# Patient Record
Sex: Male | Born: 1960
Health system: Southern US, Community
[De-identification: ages and names within clinical notes are randomized; demographics above are authoritative.]

## PROBLEM LIST (undated history)

## (undated) HISTORY — PX: NO PAST SURGERIES: SHX2092

---
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
## 2006-02-08 ENCOUNTER — Emergency Department (HOSPITAL_COMMUNITY): Admission: EM | Admit: 2006-02-08 | Discharge: 2006-02-08 | Payer: Self-pay | Admitting: Family Medicine

## 2011-01-05 ENCOUNTER — Other Ambulatory Visit: Payer: Self-pay | Admitting: Otolaryngology

## 2011-01-13 ENCOUNTER — Ambulatory Visit
Admission: RE | Admit: 2011-01-13 | Discharge: 2011-01-13 | Disposition: A | Discharge status: Home / Self Care | Payer: BC Managed Care – PPO | Source: Ambulatory Visit | Attending: Otolaryngology | Admitting: Otolaryngology

## 2014-01-03 DIAGNOSIS — E785 Hyperlipidemia, unspecified: Secondary | ICD-10-CM | POA: Insufficient documentation

## 2014-01-03 DIAGNOSIS — I493 Ventricular premature depolarization: Secondary | ICD-10-CM | POA: Insufficient documentation

## 2014-01-03 HISTORY — DX: Ventricular premature depolarization: I49.3

## 2014-01-03 HISTORY — DX: Hyperlipidemia, unspecified: E78.5

## 2015-09-01 DIAGNOSIS — A6920 Lyme disease, unspecified: Secondary | ICD-10-CM

## 2015-09-01 HISTORY — DX: Lyme disease, unspecified: A69.20

## 2017-03-25 ENCOUNTER — Ambulatory Visit
Admission: RE | Admit: 2017-03-25 | Discharge: 2017-03-25 | Disposition: A | Discharge status: Home / Self Care | Payer: Managed Care, Other (non HMO) | Source: Ambulatory Visit | Attending: Medical | Admitting: Medical

## 2017-03-25 ENCOUNTER — Other Ambulatory Visit: Payer: Self-pay | Admitting: Medical

## 2017-03-25 DIAGNOSIS — R109 Unspecified abdominal pain: Secondary | ICD-10-CM

## 2018-10-07 DIAGNOSIS — L0390 Cellulitis, unspecified: Secondary | ICD-10-CM | POA: Diagnosis not present

## 2019-03-14 DIAGNOSIS — R252 Cramp and spasm: Secondary | ICD-10-CM | POA: Diagnosis not present

## 2019-03-14 DIAGNOSIS — I499 Cardiac arrhythmia, unspecified: Secondary | ICD-10-CM | POA: Diagnosis not present

## 2019-03-14 DIAGNOSIS — R5383 Other fatigue: Secondary | ICD-10-CM | POA: Diagnosis not present

## 2019-03-16 DIAGNOSIS — R5383 Other fatigue: Secondary | ICD-10-CM | POA: Diagnosis not present

## 2019-03-16 DIAGNOSIS — R7309 Other abnormal glucose: Secondary | ICD-10-CM | POA: Diagnosis not present

## 2019-06-14 DIAGNOSIS — K591 Functional diarrhea: Secondary | ICD-10-CM | POA: Diagnosis not present

## 2019-07-02 DIAGNOSIS — K4090 Unilateral inguinal hernia, without obstruction or gangrene, not specified as recurrent: Secondary | ICD-10-CM | POA: Diagnosis not present

## 2019-07-10 DIAGNOSIS — R1031 Right lower quadrant pain: Secondary | ICD-10-CM | POA: Diagnosis not present

## 2019-07-10 DIAGNOSIS — K4020 Bilateral inguinal hernia, without obstruction or gangrene, not specified as recurrent: Secondary | ICD-10-CM | POA: Diagnosis not present

## 2019-07-19 DIAGNOSIS — R1031 Right lower quadrant pain: Secondary | ICD-10-CM | POA: Diagnosis not present

## 2019-07-19 DIAGNOSIS — Z87891 Personal history of nicotine dependence: Secondary | ICD-10-CM | POA: Diagnosis not present

## 2019-07-19 DIAGNOSIS — K4020 Bilateral inguinal hernia, without obstruction or gangrene, not specified as recurrent: Secondary | ICD-10-CM | POA: Diagnosis not present

## 2019-07-19 DIAGNOSIS — R110 Nausea: Secondary | ICD-10-CM | POA: Diagnosis not present

## 2019-07-19 DIAGNOSIS — K4021 Bilateral inguinal hernia, without obstruction or gangrene, recurrent: Secondary | ICD-10-CM | POA: Diagnosis not present

## 2019-12-25 ENCOUNTER — Ambulatory Visit: Payer: BC Managed Care – PPO | Admitting: Legal Medicine

## 2019-12-25 ENCOUNTER — Other Ambulatory Visit: Payer: Self-pay

## 2019-12-25 ENCOUNTER — Encounter: Payer: Self-pay | Admitting: Legal Medicine

## 2019-12-25 VITALS — BP 118/70 | HR 63 | Temp 98.0°F | Resp 16 | Ht 73.0 in | Wt 160.4 lb

## 2019-12-25 DIAGNOSIS — E782 Mixed hyperlipidemia: Secondary | ICD-10-CM | POA: Diagnosis not present

## 2019-12-25 DIAGNOSIS — A6920 Lyme disease, unspecified: Secondary | ICD-10-CM | POA: Insufficient documentation

## 2019-12-25 DIAGNOSIS — R5382 Chronic fatigue, unspecified: Secondary | ICD-10-CM | POA: Diagnosis not present

## 2019-12-25 DIAGNOSIS — R195 Other fecal abnormalities: Secondary | ICD-10-CM

## 2019-12-25 DIAGNOSIS — R5383 Other fatigue: Secondary | ICD-10-CM | POA: Diagnosis not present

## 2019-12-25 HISTORY — DX: Other fatigue: R53.83

## 2019-12-25 HISTORY — DX: Mixed hyperlipidemia: E78.2

## 2019-12-25 HISTORY — DX: Other fecal abnormalities: R19.5

## 2019-12-25 NOTE — Assessment & Plan Note (Signed)
[place].  Not seen in one year.

## 2019-12-25 NOTE — Progress Notes (Signed)
New Patient Office Visit  Subjective:  Patient ID: Phillip Henry, male    DOB: Mar 13, 1961  Age: 59 y.o. MRN: [id number]  CC:  Chief Complaint  Patient presents with  . Anxiety  . Diarrhea    Yellow    HPI Phillip Henry presents for anxiety, depression.  It has been going on for years.  He is active, he works 7 days a week.   His BP was up at night but OK here today.  Eats healthy. He is getting dyspnea working or with any activity. No weight changes.  He is having yellow stool for weeks but no pain with eating. No alcohol use.  Past Medical History:  Diagnosis Date  . Lyme disease 2017    History reviewed. No pertinent surgical history.  Family History  Problem Relation Age of Onset  . Rheum arthritis Mother   . Heart failure Father     Social History   Socioeconomic History  . Marital status: Married    Spouse name: Not on file  . Number of children: Not on file  . Years of education: Not on file  . Highest education level: Not on file  Occupational History  . Not on file  Tobacco Use  . Smoking status: Never Smoker  . Smokeless tobacco: Never Used  Substance and Sexual Activity  . Alcohol use: Never  . Drug use: Never  . Sexual activity: Not on file  Other Topics Concern  . Not on file  Social History Narrative  . Not on file   Social Determinants of Health   Financial Resource Strain:   . Difficulty of Paying Living Expenses:   Food Insecurity:   . Worried About Programme researcher, broadcasting/film/video in the Last Year:   . Barista in the Last Year:   Transportation Needs:   . Freight forwarder (Medical):   Marland Kitchen Lack of Transportation (Non-Medical):   Physical Activity:   . Days of Exercise per Week:   . Minutes of Exercise per Session:   Stress:   . Feeling of Stress :   Social Connections:   . Frequency of Communication with Friends and Family:   . Frequency of Social Gatherings with Friends and Family:   . Attends Religious Services:   . Active  Member of Clubs or Organizations:   . Attends Banker Meetings:   Marland Kitchen Marital Status:   Intimate Partner Violence:   . Fear of Current or Ex-Partner:   . Emotionally Abused:   Marland Kitchen Physically Abused:   . Sexually Abused:     ROS Review of Systems  Constitutional: Negative.   HENT: Negative.   Eyes: Negative.   Respiratory: Negative.   Cardiovascular: Negative.   Gastrointestinal: Negative.   Endocrine: Negative.   Genitourinary: Negative.   Musculoskeletal: Negative.   Skin: Negative.   Neurological: Negative.   Psychiatric/Behavioral: Positive for dysphoric mood.  Alcohol intolerant.  Objective:   Today's Vitals: BP 118/70   Pulse 63   Temp 98 F (36.7 C)   Resp 16   Ht 6\' 1"  (1.854 m)   Wt 160 lb 6.4 oz (72.8 kg)   SpO2 98%   BMI 21.16 kg/m   Physical Exam Vitals reviewed.  Constitutional:      Appearance: Normal appearance. He is normal weight.  HENT:     Head: Normocephalic and atraumatic.     Right Ear: Tympanic membrane normal.     Left Ear: Tympanic membrane normal.  Nose: Nose normal.     Mouth/Throat:     Mouth: Mucous membranes are dry.  Eyes:     Extraocular Movements: Extraocular movements intact.     Conjunctiva/sclera: Conjunctivae normal.     Pupils: Pupils are equal, round, and reactive to light.  Cardiovascular:     Rate and Rhythm: Normal rate and regular rhythm.     Pulses: Normal pulses.     Heart sounds: Normal heart sounds.  Pulmonary:     Effort: Pulmonary effort is normal.     Breath sounds: Normal breath sounds.  Abdominal:     General: Abdomen is flat. Bowel sounds are normal.     Palpations: Abdomen is soft.  Musculoskeletal:        General: Normal range of motion.     Cervical back: Normal range of motion and neck supple.  Skin:    General: Skin is warm and dry.  Neurological:     General: No focal deficit present.     Mental Status: He is alert and oriented to person, place, and time.   PHQ9=9 EKG: Sinus  brady cardia, rate 53, QRS 50ms, PR 1118 ms, QTC 394, axis 35. Assessment & Plan:   Problem List Items Addressed This Visit      Other   Lyme disease    [place].  Not seen in one year.      Relevant Orders   US Abdomen Complete   Fatigue - Primary    Patient has chronic fatigue and worse for last 2 weeks, he gets dyspnea at little activity, EKG show sinus bradycardia and patient is not an regular exerciser.  Refer for cardiology evaluation.      Relevant Orders   CBC with Differential/Platelet   Comprehensive metabolic panel   TSH   EKG 12-Lead   US Abdomen Complete   Ambulatory referral to Cardiology   Acholic stool    Suspect gall bladder or liver disease.  No tenderness in abdomen.      Mixed hyperlipidemia    Patient has history of hyperlipidemia but not checked in years.  We will get lipid level      Relevant Orders   Lipid panel      No outpatient encounter medications on file as of 12/25/2019.   No facility-administered encounter medications on file as of 12/25/2019.    Follow-up: Return in about 2 weeks (around 01/08/2020).   Reinaldo Meeker, MD

## 2019-12-25 NOTE — Assessment & Plan Note (Signed)
Suspect gall bladder or liver disease.  No tenderness in abdomen.

## 2019-12-25 NOTE — Assessment & Plan Note (Signed)
Patient has chronic fatigue and worse for last 2 weeks, he gets dyspnea at little activity, EKG show sinus bradycardia and patient is not an regular exerciser.  Refer for cardiology evaluation.

## 2019-12-25 NOTE — Assessment & Plan Note (Signed)
Patient has history of hyperlipidemia but not checked in years.  We will get lipid level

## 2019-12-26 LAB — CBC WITH DIFFERENTIAL/PLATELET
Basophils Absolute: 0 10*3/uL (ref 0.0–0.2)
Basos: 1 %
EOS (ABSOLUTE): 0.1 10*3/uL (ref 0.0–0.4)
Eos: 2 %
Hematocrit: 44.4 % (ref 37.5–51.0)
Hemoglobin: 14.5 g/dL (ref 13.0–17.7)
Immature Grans (Abs): 0 10*3/uL (ref 0.0–0.1)
Immature Granulocytes: 0 %
Lymphocytes Absolute: 1.3 10*3/uL (ref 0.7–3.1)
Lymphs: 23 %
MCH: 29.7 pg (ref 26.6–33.0)
MCHC: 32.7 g/dL (ref 31.5–35.7)
MCV: 91 fL (ref 79–97)
Monocytes Absolute: 0.6 10*3/uL (ref 0.1–0.9)
Monocytes: 11 %
Neutrophils Absolute: 3.6 10*3/uL (ref 1.4–7.0)
Neutrophils: 63 %
Platelets: 237 10*3/uL (ref 150–450)
RBC: 4.88 x10E6/uL (ref 4.14–5.80)
RDW: 13.1 % (ref 11.6–15.4)
WBC: 5.7 10*3/uL (ref 3.4–10.8)

## 2019-12-26 LAB — COMPREHENSIVE METABOLIC PANEL
ALT: 17 IU/L (ref 0–44)
AST: 19 IU/L (ref 0–40)
Albumin/Globulin Ratio: 2.4 — ABNORMAL HIGH (ref 1.2–2.2)
Albumin: 4.3 g/dL (ref 3.8–4.9)
Alkaline Phosphatase: 71 IU/L (ref 39–117)
BUN/Creatinine Ratio: 16 (ref 10–24)
BUN: 18 mg/dL (ref 8–27)
Bilirubin Total: 0.3 mg/dL (ref 0.0–1.2)
CO2: 26 mmol/L (ref 20–29)
Calcium: 9.4 mg/dL (ref 8.6–10.2)
Chloride: 107 mmol/L — ABNORMAL HIGH (ref 96–106)
Creatinine, Ser: 1.14 mg/dL (ref 0.76–1.27)
GFR calc Af Amer: 80 mL/min/{1.73_m2} (ref >59)
GFR calc non Af Amer: 70 mL/min/{1.73_m2} (ref >59)
Globulin, Total: 1.8 g/dL (ref 1.5–4.5)
Glucose: 89 mg/dL (ref 65–99)
Potassium: 4.6 mmol/L (ref 3.5–5.2)
Sodium: 144 mmol/L (ref 134–144)
Total Protein: 6.1 g/dL (ref 6.0–8.5)

## 2019-12-26 LAB — LIPID PANEL
Chol/HDL Ratio: 2.6 ratio (ref 0.0–5.0)
Cholesterol, Total: 178 mg/dL (ref 100–199)
HDL: 68 mg/dL (ref >39)
LDL Chol Calc (NIH): 90 mg/dL (ref 0–99)
Triglycerides: 113 mg/dL (ref 0–149)
VLDL Cholesterol Cal: 20 mg/dL (ref 5–40)

## 2019-12-26 LAB — CARDIOVASCULAR RISK ASSESSMENT

## 2019-12-26 LAB — TSH: TSH: 1.92 u[IU]/mL (ref 0.450–4.500)

## 2019-12-26 NOTE — Progress Notes (Signed)
CBC all normal, kidney tests normal, liver tests normal, Cholesterol normal good, TSH normal level lp

## 2020-01-02 ENCOUNTER — Ambulatory Visit (INDEPENDENT_AMBULATORY_CARE_PROVIDER_SITE_OTHER): Payer: BC Managed Care – PPO | Admitting: Cardiology

## 2020-01-02 ENCOUNTER — Ambulatory Visit (INDEPENDENT_AMBULATORY_CARE_PROVIDER_SITE_OTHER): Payer: BC Managed Care – PPO

## 2020-01-02 ENCOUNTER — Other Ambulatory Visit: Payer: Self-pay

## 2020-01-02 ENCOUNTER — Encounter: Payer: Self-pay | Admitting: Cardiology

## 2020-01-02 VITALS — BP 134/74 | HR 60 | Ht 73.0 in | Wt 163.0 lb

## 2020-01-02 DIAGNOSIS — R011 Cardiac murmur, unspecified: Secondary | ICD-10-CM

## 2020-01-02 DIAGNOSIS — R002 Palpitations: Secondary | ICD-10-CM | POA: Insufficient documentation

## 2020-01-02 DIAGNOSIS — Z8619 Personal history of other infectious and parasitic diseases: Secondary | ICD-10-CM | POA: Diagnosis not present

## 2020-01-02 DIAGNOSIS — Z299 Encounter for prophylactic measures, unspecified: Secondary | ICD-10-CM

## 2020-01-02 HISTORY — DX: Personal history of other infectious and parasitic diseases: Z86.19

## 2020-01-02 HISTORY — DX: Palpitations: R00.2

## 2020-01-02 HISTORY — DX: Cardiac murmur, unspecified: R01.1

## 2020-01-02 NOTE — Progress Notes (Signed)
Cardiology Office Note:    Date:  01/02/2020   ID:  Phillip Henry, DOB [birth year], MRN [id number]  PCP:  Lillard Anes, MD  Cardiologist:  Jenean Lindau, MD   Referring MD: Lillard Anes,*    ASSESSMENT:    1. Palpitations   2. Cardiac murmur   3. History of Lyme disease    PLAN:    In order of problems listed above:  1. Primary prevention stressed with the patient.  Importance of compliance with diet and medication stressed and he vocalized understanding.  I told him to walk at least half an hour a day on a regular basis.  He has no issues with walking. 2. Palpitations: I reassured him about my findings.  His TSH is unremarkable.  Her lab work was reviewed including lipids.  He will have a 3-day ZIO monitor to assess the symptoms. 3. Cardiac murmur: Echocardiogram will be done to assess the symptoms. 4. Risk stratification: I discussed calcium scoring CT scan and is agreeable. 5. Patient will be seen in follow-up appointment in 6 months or earlier if the patient has any concerns    Medication Adjustments/Labs and Tests Ordered: Current medicines are reviewed at length with the patient today.  Concerns regarding medicines are outlined above.  No orders of the defined types were placed in this encounter.  No orders of the defined types were placed in this encounter.    History of Present Illness:    Phillip Henry is a 59 y.o. male who is being seen today for the evaluation of palpitations at the request of Lillard Anes,*.  Patient is a pleasant 59 year old male.  He has no significant past medical history.  He is gives history of Lyme's disease.  Even his recent blood work is unremarkable including his lipids.  He mentions to me that he occasionally has elevations in blood pressure.  He says that this is probably related to his job.  No chest pain orthopnea or PND.  At the time of my evaluation, the patient is alert awake oriented and in no distress.   He mentions palpitations at times and sometimes slow heartbeat.  At the time of my evaluation, the patient is alert awake oriented and in no distress.  Past Medical History:  Diagnosis Date  . Acholic stool 4/62/7035  . Dyslipidemia 01/03/2014  . Fatigue 12/25/2019  . Lyme disease 2017  . Mixed hyperlipidemia 12/25/2019  . Premature ventricular contraction 01/03/2014    History reviewed. No pertinent surgical history.  Current Medications: No outpatient medications have been marked as taking for the 01/02/20 encounter (Office Visit) with Marny Smethers, Reita Cliche, MD.     Allergies:   Patient has no known allergies.   Social History   Socioeconomic History  . Marital status: Married    Spouse name: Not on file  . Number of children: Not on file  . Years of education: Not on file  . Highest education level: Not on file  Occupational History  . Not on file  Tobacco Use  . Smoking status: Never Smoker  . Smokeless tobacco: Never Used  Substance and Sexual Activity  . Alcohol use: Never  . Drug use: Never  . Sexual activity: Not on file  Other Topics Concern  . Not on file  Social History Narrative  . Not on file   Social Determinants of Health   Financial Resource Strain:   . Difficulty of Paying Living Expenses:   Food Insecurity:   .  Worried About Programme researcher, broadcasting/film/video in the Last Year:   . Barista in the Last Year:   Transportation Needs:   . Freight forwarder (Medical):   Marland Kitchen Lack of Transportation (Non-Medical):   Physical Activity:   . Days of Exercise per Week:   . Minutes of Exercise per Session:   Stress:   . Feeling of Stress :   Social Connections:   . Frequency of Communication with Friends and Family:   . Frequency of Social Gatherings with Friends and Family:   . Attends Religious Services:   . Active Member of Clubs or Organizations:   . Attends Banker Meetings:   Marland Kitchen Marital Status:      Family History: The patient's family history  includes Heart failure in his father; Rheum arthritis in his mother.  ROS:   Please see the history of present illness.    All other systems reviewed and are negative.  EKGs/Labs/Other Studies Reviewed:    The following studies were reviewed today: I reviewed records and EKG revealed sinus rhythm and nonspecific ST-T changes   Recent Labs: 12/25/2019: ALT 17; BUN 18; Creatinine, Ser 1.14; Hemoglobin 14.5; Platelets 237; Potassium 4.6; Sodium 144; TSH 1.920  Recent Lipid Panel    Component Value Date/Time   CHOL 178 12/25/2019 1550   TRIG 113 12/25/2019 1550   HDL 68 12/25/2019 1550   CHOLHDL 2.6 12/25/2019 1550   LDLCALC 90 12/25/2019 1550    Physical Exam:    VS:  BP 134/74   Pulse 60   Ht 6\' 1"  (1.854 m)   Wt 163 lb (73.9 kg)   SpO2 99%   BMI 21.51 kg/m     Wt Readings from Last 3 Encounters:  01/02/20 163 lb (73.9 kg)  12/25/19 160 lb 6.4 oz (72.8 kg)     GEN: Patient is in no acute distress HEENT: Normal NECK: No JVD; No carotid bruits LYMPHATICS: No lymphadenopathy CARDIAC: S1 S2 regular, 2/6 systolic murmur at the apex. RESPIRATORY:  Clear to auscultation without rales, wheezing or rhonchi  ABDOMEN: Soft, non-tender, non-distended MUSCULOSKELETAL:  No edema; No deformity  SKIN: Warm and dry NEUROLOGIC:  Alert and oriented x 3 PSYCHIATRIC:  Normal affect    Signed, 12/27/19, MD  01/02/2020 10:14 AM    [facility]

## 2020-01-02 NOTE — Patient Instructions (Signed)
Medication Instructions:  No medication changes *If you need a refill on your cardiac medications before your next appointment, please call your pharmacy*   Lab Work: No ordered If you have labs (blood work) drawn today and your tests are completely normal, you will receive your results only by: Marland Kitchen MyChart Message (if you have MyChart) OR . A paper copy in the mail If you have any lab test that is abnormal or we need to change your treatment, we will call you to review the results.   Testing/Procedures:  WHY IS MY DOCTOR PRESCRIBING ZIO? The Zio system is proven and trusted by physicians to detect and diagnose irregular heart rhythms - and has been prescribed to hundreds of thousands of patients.  The FDA has cleared the Zio system to monitor for many different kinds of irregular heart rhythms. In a study, physicians were able to reach a diagnosis 90% of the time with the Zio system1.  You can wear the Zio monitor - a small, discreet, comfortable patch - during your normal day-to-day activity, including while you sleep, shower, and exercise, while it records every single heartbeat for analysis.  1Barrett, P., et al. Comparison of 24 Hour Holter Monitoring Versus 14 Day Novel Adhesive Patch Electrocardiographic Monitoring. American Journal of Medicine, 2014.  ZIO VS. HOLTER MONITORING The Zio monitor can be comfortably worn for up to 14 days. Holter monitors can be worn for 24 to 48 hours, limiting the time to record any irregular heart rhythms you may have. Zio is able to capture data for the 51% of patients who have their first symptom-triggered arrhythmia after 48 hours.1  LIVE WITHOUT RESTRICTIONS The Zio ambulatory cardiac monitor is a small, unobtrusive, and water-resistant patch-you might even forget you're wearing it. The Zio monitor records and stores every beat of your heart, whether you're sleeping, working out, or showering. Wear for 3 days,remove 01/05/20.  Your physician has  requested that you have an echocardiogram. Echocardiography is a painless test that uses sound waves to create images of your heart. It provides your doctor with information about the size and shape of your heart and how well your heart's chambers and valves are working. This procedure takes approximately one hour. There are no restrictions for this procedure.  We will order CT coronary calcium score $150  Please call [phone] to schedule   [facility]  [address]  [place], [place] [address]     Follow-Up: At [facility], you and your health needs are our priority.  As part of our continuing mission to provide you with exceptional heart care, we have created designated Provider Care Teams.  These Care Teams include your primary Cardiologist (physician) and Advanced Practice Providers (APPs -  Physician Assistants and Nurse Practitioners) who all work together to provide you with the care you need, when you need it.  We recommend signing up for the patient portal called "MyChart".  Sign up information is provided on this After Visit Summary.  MyChart is used to connect with patients for Virtual Visits (Telemedicine).  Patients are able to view lab/test results, encounter notes, upcoming appointments, etc.  Non-urgent messages can be sent to your provider as well.   To learn more about what you can do with MyChart, go to [email or web address].    Your next appointment:   2 month(s)  The format for your next appointment:   In Person  Provider:   Belva Crome, MD   Other Instructions  Coronary Calcium  Scan A coronary calcium scan is an imaging test used to look for deposits of plaque in the inner lining of the blood vessels of the heart (coronary arteries). Plaque is made up of calcium, protein, and fatty substances. These deposits of plaque can partly clog and narrow the coronary arteries without producing any symptoms or warning signs. This puts a person at  risk for a heart attack. This test is recommended for people who are at moderate risk for heart disease. The test can find plaque deposits before symptoms develop. Tell a health care provider about:  Any allergies you have.  All medicines you are taking, including vitamins, herbs, eye drops, creams, and over-the-counter medicines.  Any problems you or family members have had with anesthetic medicines.  Any blood disorders you have.  Any surgeries you have had.  Any medical conditions you have.  Whether you are pregnant or may be pregnant. What are the risks? Generally, this is a safe procedure. However, problems may occur, including:  Harm to a pregnant woman and her unborn baby. This test involves the use of radiation. Radiation exposure can be dangerous to a pregnant woman and her unborn baby. If you are pregnant or think you may be pregnant, you should not have this procedure done.  Slight increase in the risk of cancer. This is because of the radiation involved in the test. What happens before the procedure? Ask your health care provider for any specific instructions on how to prepare for this procedure. You may be asked to avoid products that contain caffeine, tobacco, or nicotine for 4 hours before the procedure. What happens during the procedure?   You will undress and remove any jewelry from your neck or chest.  You will put on a hospital gown.  Sticky electrodes will be placed on your chest. The electrodes will be connected to an electrocardiogram (ECG) machine to record a tracing of the electrical activity of your heart.  You will lie down on a curved bed that is attached to the CT scanner.  You may be given medicine to slow down your heart rate so that clear pictures can be created.  You will be moved into the CT scanner, and the CT scanner will take pictures of your heart. During this time, you will be asked to lie still and hold your breath for 2-3 seconds at a time  while each picture of your heart is being taken. The procedure may vary among health care providers and hospitals. What happens after the procedure?  You can get dressed.  You can return to your normal activities.  It is up to you to get the results of your procedure. Ask your health care provider, or the department that is doing the procedure, when your results will be ready. Summary  A coronary calcium scan is an imaging test used to look for deposits of plaque in the inner lining of the blood vessels of the heart (coronary arteries). Plaque is made up of calcium, protein, and fatty substances.  Generally, this is a safe procedure. Tell your health care provider if you are pregnant or may be pregnant.  Ask your health care provider for any specific instructions on how to prepare for this procedure.  A CT scanner will take pictures of your heart.  You can return to your normal activities after the scan is done. This information is not intended to replace advice given to you by your health care provider. Make sure you discuss any questions  you have with your health care provider. Document Revised: 03/07/2019 Document Reviewed: 03/07/2019 Elsevier Patient Education  [place].  Echocardiogram An echocardiogram is a procedure that uses painless sound waves (ultrasound) to produce an image of the heart. Images from an echocardiogram can provide important information about:  Signs of coronary artery disease (CAD).  Aneurysm detection. An aneurysm is a weak or damaged part of an artery wall that bulges out from the normal force of blood pumping through the body.  Heart size and shape. Changes in the size or shape of the heart can be associated with certain conditions, including heart failure, aneurysm, and CAD.  Heart muscle function.  Heart valve function.  Signs of a past heart attack.  Fluid buildup around the heart.  Thickening of the heart muscle.  A tumor or  infectious growth around the heart valves. Tell a health care provider about:  Any allergies you have.  All medicines you are taking, including vitamins, herbs, eye drops, creams, and over-the-counter medicines.  Any blood disorders you have.  Any surgeries you have had.  Any medical conditions you have.  Whether you are pregnant or may be pregnant. What are the risks? Generally, this is a safe procedure. However, problems may occur, including:  Allergic reaction to dye (contrast) that may be used during the procedure. What happens before the procedure? No specific preparation is needed. You may eat and drink normally. What happens during the procedure?   An IV tube may be inserted into one of your veins.  You may receive contrast through this tube. A contrast is an injection that improves the quality of the pictures from your heart.  A gel will be applied to your chest.  A wand-like tool (transducer) will be moved over your chest. The gel will help to transmit the sound waves from the transducer.  The sound waves will harmlessly bounce off of your heart to allow the heart images to be captured in real-time motion. The images will be recorded on a computer. The procedure may vary among health care providers and hospitals. What happens after the procedure?  You may return to your normal, everyday life, including diet, activities, and medicines, unless your health care provider tells you not to do that. Summary  An echocardiogram is a procedure that uses painless sound waves (ultrasound) to produce an image of the heart.  Images from an echocardiogram can provide important information about the size and shape of your heart, heart muscle function, heart valve function, and fluid buildup around your heart.  You do not need to do anything to prepare before this procedure. You may eat and drink normally.  After the echocardiogram is completed, you may return to your normal,  everyday life, unless your health care provider tells you not to do that. This information is not intended to replace advice given to you by your health care provider. Make sure you discuss any questions you have with your health care provider. Document Revised: 12/08/2018 Document Reviewed: 09/19/2016 Elsevier Patient Education  [place].

## 2020-01-08 DIAGNOSIS — R93421 Abnormal radiologic findings on diagnostic imaging of right kidney: Secondary | ICD-10-CM | POA: Diagnosis not present

## 2020-01-08 DIAGNOSIS — I700 Atherosclerosis of aorta: Secondary | ICD-10-CM | POA: Diagnosis not present

## 2020-01-08 DIAGNOSIS — A6920 Lyme disease, unspecified: Secondary | ICD-10-CM | POA: Diagnosis not present

## 2020-01-08 DIAGNOSIS — K909 Intestinal malabsorption, unspecified: Secondary | ICD-10-CM | POA: Diagnosis not present

## 2020-01-08 DIAGNOSIS — R5383 Other fatigue: Secondary | ICD-10-CM | POA: Diagnosis not present

## 2020-01-08 DIAGNOSIS — R93422 Abnormal radiologic findings on diagnostic imaging of left kidney: Secondary | ICD-10-CM | POA: Diagnosis not present

## 2020-01-15 ENCOUNTER — Other Ambulatory Visit: Payer: Self-pay

## 2020-01-15 ENCOUNTER — Ambulatory Visit (INDEPENDENT_AMBULATORY_CARE_PROVIDER_SITE_OTHER)
Admission: RE | Admit: 2020-01-15 | Discharge: 2020-01-15 | Disposition: A | Discharge status: Home / Self Care | Payer: BC Managed Care – PPO | Source: Ambulatory Visit | Attending: Cardiology | Admitting: Cardiology

## 2020-01-15 DIAGNOSIS — Z8619 Personal history of other infectious and parasitic diseases: Secondary | ICD-10-CM

## 2020-01-16 MED ORDER — ATORVASTATIN CALCIUM 10 MG PO TABS
10.0000 mg | ORAL_TABLET | Freq: Every day | ORAL | 11 refills | Status: DC
Start: 2020-01-16 — End: 2021-12-19

## 2020-01-16 NOTE — Addendum Note (Signed)
Addended by: Eleonore Chiquito on: 01/16/2020 08:28 AM   Modules accepted: Orders

## 2020-01-18 ENCOUNTER — Other Ambulatory Visit: Payer: BC Managed Care – PPO

## 2020-02-05 ENCOUNTER — Ambulatory Visit (INDEPENDENT_AMBULATORY_CARE_PROVIDER_SITE_OTHER): Payer: BC Managed Care – PPO

## 2020-02-05 ENCOUNTER — Other Ambulatory Visit: Payer: Self-pay

## 2020-02-05 DIAGNOSIS — R011 Cardiac murmur, unspecified: Secondary | ICD-10-CM

## 2020-02-05 NOTE — Progress Notes (Signed)
Complete echocardiogram has ben performed.  Jimmy Kiylah Loyer RDCS, RVT 

## 2020-02-14 ENCOUNTER — Ambulatory Visit: Payer: BC Managed Care – PPO | Admitting: Cardiology

## 2020-02-14 ENCOUNTER — Other Ambulatory Visit: Payer: Self-pay

## 2020-02-14 VITALS — BP 128/66 | HR 62 | Ht 73.0 in | Wt 162.4 lb

## 2020-02-14 DIAGNOSIS — E782 Mixed hyperlipidemia: Secondary | ICD-10-CM | POA: Diagnosis not present

## 2020-02-14 DIAGNOSIS — I428 Other cardiomyopathies: Secondary | ICD-10-CM | POA: Diagnosis not present

## 2020-02-14 DIAGNOSIS — I429 Cardiomyopathy, unspecified: Secondary | ICD-10-CM | POA: Insufficient documentation

## 2020-02-14 HISTORY — DX: Cardiomyopathy, unspecified: I42.9

## 2020-02-14 MED ORDER — METOPROLOL TARTRATE 100 MG PO TABS
100.0000 mg | ORAL_TABLET | Freq: Once | ORAL | 0 refills | Status: DC
Start: 2020-02-14 — End: 2020-04-01

## 2020-02-14 NOTE — Patient Instructions (Addendum)
.Medication Instructions:  No medication changes. *If you need a refill on your cardiac medications before your next appointment, please call your pharmacy*   Lab Work: Your physician recommends that you have a BMET today in the office. Your physician recommends that you return for lab work in: 10 days you need to have an additional BMET drawn.  You can come Monday through Friday 8:30 am to 12:00 pm and 1:15 to 4:30. You do not need to make an appointment as the order has already been placed.    If you have labs (blood work) drawn today and your tests are completely normal, you will receive your results only by: Marland Kitchen MyChart Message (if you have MyChart) OR . A paper copy in the mail If you have any lab test that is abnormal or we need to change your treatment, we will call you to review the results.   Testing/Procedures: Your cardiac CT will be scheduled at one of the below locations:   [facility] [address] [place], [place] [address] [phone]  [facility], please arrive at the [facility] main entrance of [facility] 30 minutes prior to test start time. Proceed to the [facility] Radiology Department (first floor) to check-in and test prep.  Please follow these instructions carefully (unless otherwise directed):  Hold all erectile dysfunction medications at least 3 days (72 hrs) prior to test.  On the Night Before the Test: . Be sure to Drink plenty of water. . Do not consume any caffeinated/decaffeinated beverages or chocolate 12 hours prior to your test. . Do not take any antihistamines 12 hours prior to your test.   On the Day of the Test: . Drink plenty of water. Do not drink any water within one hour of the test. . Do not eat any food 4 hours prior to the test. . You may take your regular medications prior to the test.  . Take metoprolol (Lopressor) two hours prior to test.       After the Test: . Drink plenty of water. . After  receiving IV contrast, you may experience a mild flushed feeling. This is normal. . On occasion, you may experience a mild rash up to 24 hours after the test. This is not dangerous. If this occurs, you can take Benadryl 25 mg and increase your fluid intake. . If you experience trouble breathing, this can be serious. If it is severe call 911 IMMEDIATELY. If it is mild, please call our office. . If you take any of these medications: Glipizide/Metformin, Avandament, Glucavance, please do not take 48 hours after completing test unless otherwise instructed.   Once we have confirmed authorization from your insurance company, we will call you to set up a date and time for your test.   For non-scheduling related questions, please contact the cardiac imaging nurse navigator should you have any questions/concerns: Marchia Bond, Cardiac Imaging Nurse Navigator Burley Saver, Interim Cardiac Imaging Nurse [facility] Direct Office Dial: [phone]   For scheduling needs, including cancellations and rescheduling, please call [phone].   Follow-Up: At [facility], you and your health needs are our priority.  As part of our continuing mission to provide you with exceptional heart care, we have created designated Provider Care Teams.  These Care Teams include your primary Cardiologist (physician) and Advanced Practice Providers (APPs -  Physician Assistants and Nurse Practitioners) who all work together to provide you with the care you need,  when you need it.  We recommend signing up for the patient portal called "MyChart".  Sign up information is provided on this After Visit Summary.  MyChart is used to connect with patients for Virtual Visits (Telemedicine).  Patients are able to view lab/test results, encounter notes, upcoming appointments, etc.  Non-urgent messages can be sent to your provider as well.   To learn more about what you can do with MyChart, go to  [email or web address].    Your next appointment:   1 month(s)  The format for your next appointment:   In Person  Provider:   Jyl Heinz, MD   Other Instructions NA

## 2020-02-14 NOTE — Progress Notes (Signed)
Cardiology Office Note:    Date:  02/14/2020   ID:  Phillip Henry, DOB April 20, 1961, MRN [id number]  PCP:  Abigail Miyamoto, MD  Cardiologist:  Garwin Brothers, MD   Referring MD: Abigail Miyamoto,*    ASSESSMENT:    1. Mixed hyperlipidemia   2. Other cardiomyopathy (HCC)    PLAN:    In order of problems listed above:  1. Primary prevention stressed with the patient.  Importance of compliance with diet medication stressed he vocalized understanding. 2. Cardiomyopathy with mildly reduced ejection fraction.  I discussed with him my findings at extensive length and his wife had also had multiple questions which were answered to her satisfaction.  In view of elevated calcium score I will do a CT coronary angiography with FFR to assess for any obstructive coronary artery disease as a reason for this finding.  I am not convinced that it is so but I would like to rule it out.  Patient is agreeable.  Also in view of cardiomyopathy I will do a Chem-7 today and start Entresto at a low dose.  He will be back in 10 days for a follow-up Chem-7.  I told him to keep himself well-hydrated with adequate salt and water in the diet and is agreeable. 3. Follow-up appointment in a month or earlier if he has any concerns. 4. Mixed dyslipidemia: Diet was emphasized.  Patient is on statin therapy.  Importance of regular exercise stressed.   Medication Adjustments/Labs and Tests Ordered: Current medicines are reviewed at length with the patient today.  Concerns regarding medicines are outlined above.  No orders of the defined types were placed in this encounter.  No orders of the defined types were placed in this encounter.    No chief complaint on file.    History of Present Illness:    Phillip Henry is a 59 y.o. male.  Patient was evaluated by me for palpitations.  He is CT calcium score was elevated so is on a statin therapy and tolerating it well.  Interestingly his ejection fraction was  revealed to be mildly low by echocardiogram and is here for follow-up.  He denies any chest pain orthopnea or PND.  He leads a sedentary lifestyle.  At the time of my evaluation, the patient is alert awake oriented and in no distress.  Past Medical History:  Diagnosis Date  . Acholic stool 12/25/2019  . Cardiac murmur 01/02/2020  . Dyslipidemia 01/03/2014  . Fatigue 12/25/2019  . History of Lyme disease 01/02/2020  . Lyme disease 2017  . Mixed hyperlipidemia 12/25/2019  . Palpitations 01/02/2020  . Premature ventricular contraction 01/03/2014    History reviewed. No pertinent surgical history.  Current Medications: Current Meds  Medication Sig  . atorvastatin (LIPITOR) 10 MG tablet Take 1 tablet (10 mg total) by mouth daily.     Allergies:   Patient has no known allergies.   Social History   Socioeconomic History  . Marital status: Married    Spouse name: Not on file  . Number of children: Not on file  . Years of education: Not on file  . Highest education level: Not on file  Occupational History  . Not on file  Tobacco Use  . Smoking status: Never Smoker  . Smokeless tobacco: Never Used  Substance and Sexual Activity  . Alcohol use: Never  . Drug use: Never  . Sexual activity: Not on file  Other Topics Concern  . Not on file  Social History  Narrative  . Not on file   Social Determinants of Health   Financial Resource Strain:   . Difficulty of Paying Living Expenses:   Food Insecurity:   . Worried About Charity fundraiser in the Last Year:   . Arboriculturist in the Last Year:   Transportation Needs:   . Film/video editor (Medical):   Marland Kitchen Lack of Transportation (Non-Medical):   Physical Activity:   . Days of Exercise per Week:   . Minutes of Exercise per Session:   Stress:   . Feeling of Stress :   Social Connections:   . Frequency of Communication with Friends and Family:   . Frequency of Social Gatherings with Friends and Family:   . Attends Religious  Services:   . Active Member of Clubs or Organizations:   . Attends Archivist Meetings:   Marland Kitchen Marital Status:      Family History: The patient's family history includes Heart failure in his father; Rheum arthritis in his mother.  ROS:   Please see the history of present illness.    All other systems reviewed and are negative.  EKGs/Labs/Other Studies Reviewed:    The following studies were reviewed today: IMPRESSIONS    1. Left ventricular ejection fraction, by estimation, is 45 to 50%. The  left ventricle has mildly decreased function. The left ventricle has no  regional wall motion abnormalities. Left ventricular diastolic parameters  were normal.  2. Right ventricular systolic function is normal. The right ventricular  size is normal. There is normal pulmonary artery systolic pressure.  3. The mitral valve is normal in structure. No evidence of mitral valve  regurgitation. No evidence of mitral stenosis.  4. The aortic valve is normal in structure. Aortic valve regurgitation is  not visualized. No aortic stenosis is present.  5. There is mild dilatation of the ascending aorta.  6. The inferior vena cava is normal in size with greater than 50%  respiratory variability, suggesting right atrial pressure of 3 mmHg.    Recent Labs: 12/25/2019: ALT 17; BUN 18; Creatinine, Ser 1.14; Hemoglobin 14.5; Platelets 237; Potassium 4.6; Sodium 144; TSH 1.920  Recent Lipid Panel    Component Value Date/Time   CHOL 178 12/25/2019 1550   TRIG 113 12/25/2019 1550   HDL 68 12/25/2019 1550   CHOLHDL 2.6 12/25/2019 1550   LDLCALC 90 12/25/2019 1550    Physical Exam:    VS:  BP 128/66   Pulse 62   Ht 6\' 1"  (1.854 m)   Wt 162 lb 6.4 oz (73.7 kg)   SpO2 99%   BMI 21.43 kg/m     Wt Readings from Last 3 Encounters:  02/14/20 162 lb 6.4 oz (73.7 kg)  01/02/20 163 lb (73.9 kg)  12/25/19 160 lb 6.4 oz (72.8 kg)     GEN: Patient is in no acute distress HEENT:  Normal NECK: No JVD; No carotid bruits LYMPHATICS: No lymphadenopathy CARDIAC: Hear sounds regular, 2/6 systolic murmur at the apex. RESPIRATORY:  Clear to auscultation without rales, wheezing or rhonchi  ABDOMEN: Soft, non-tender, non-distended MUSCULOSKELETAL:  No edema; No deformity  SKIN: Warm and dry NEUROLOGIC:  Alert and oriented x 3 PSYCHIATRIC:  Normal affect   Signed, Jenean Lindau, MD  02/14/2020 4:34 PM    [facility]

## 2020-02-15 ENCOUNTER — Telehealth: Payer: Self-pay

## 2020-02-15 LAB — BASIC METABOLIC PANEL
BUN/Creatinine Ratio: 17 (ref 10–24)
BUN: 17 mg/dL (ref 8–27)
CO2: 26 mmol/L (ref 20–29)
Calcium: 9.5 mg/dL (ref 8.6–10.2)
Chloride: 103 mmol/L (ref 96–106)
Creatinine, Ser: 1.03 mg/dL (ref 0.76–1.27)
GFR calc Af Amer: 91 mL/min/{1.73_m2} (ref >59)
GFR calc non Af Amer: 79 mL/min/{1.73_m2} (ref >59)
Glucose: 76 mg/dL (ref 65–99)
Potassium: 4.3 mmol/L (ref 3.5–5.2)
Sodium: 143 mmol/L (ref 134–144)

## 2020-02-15 NOTE — Telephone Encounter (Signed)
-----   Message from Garwin Brothers, MD sent at 02/15/2020  8:40 AM EDT ----- The results of the study is unremarkable. Please inform patient. I will discuss in detail at next appointment. Cc  primary care/referring physician Garwin Brothers, MD 02/15/2020 8:40 AM

## 2020-02-15 NOTE — Telephone Encounter (Signed)
Spoke with patient regarding results and recommendation.  Patient verbalizes understanding and is agreeable to plan of care. Advised patient to call back with any issues or concerns.  

## 2020-03-05 ENCOUNTER — Ambulatory Visit: Payer: BC Managed Care – PPO | Admitting: Cardiology

## 2020-03-05 ENCOUNTER — Other Ambulatory Visit: Payer: Self-pay

## 2020-03-05 ENCOUNTER — Other Ambulatory Visit: Payer: Self-pay | Admitting: Cardiology

## 2020-03-05 ENCOUNTER — Encounter: Payer: Self-pay | Admitting: Cardiology

## 2020-03-05 VITALS — BP 124/70 | HR 72 | Ht 73.0 in | Wt 165.0 lb

## 2020-03-05 DIAGNOSIS — E782 Mixed hyperlipidemia: Secondary | ICD-10-CM | POA: Diagnosis not present

## 2020-03-05 DIAGNOSIS — I428 Other cardiomyopathies: Secondary | ICD-10-CM

## 2020-03-05 DIAGNOSIS — I429 Cardiomyopathy, unspecified: Secondary | ICD-10-CM

## 2020-03-05 MED ORDER — SACUBITRIL-VALSARTAN 24-26 MG PO TABS: 1.0000 | ORAL_TABLET | Freq: Two times a day (BID) | ORAL | 2 refills | Status: DC

## 2020-03-05 NOTE — Patient Instructions (Signed)
Medication Instructions:  No medication changes. *If you need a refill on your cardiac medications before your next appointment, please call your pharmacy*   Lab Work: Your physician recommends that you return for lab work in: 1o days after starting [employer].  If you have labs (blood work) drawn today and your tests are completely normal, you will receive your results only by: Marland Kitchen MyChart Message (if you have MyChart) OR . A paper copy in the mail If you have any lab test that is abnormal or we need to change your treatment, we will call you to review the results.   Testing/Procedures: None ordered   Follow-Up: At [facility], you and your health needs are our priority.  As part of our continuing mission to provide you with exceptional heart care, we have created designated Provider Care Teams.  These Care Teams include your primary Cardiologist (physician) and Advanced Practice Providers (APPs -  Physician Assistants and Nurse Practitioners) who all work together to provide you with the care you need, when you need it.  We recommend signing up for the patient portal called "MyChart".  Sign up information is provided on this After Visit Summary.  MyChart is used to connect with patients for Virtual Visits (Telemedicine).  Patients are able to view lab/test results, encounter notes, upcoming appointments, etc.  Non-urgent messages can be sent to your provider as well.   To learn more about what you can do with MyChart, go to [email or web address].    Your next appointment:   3 month(s)  The format for your next appointment:   In Person  Provider:   Belva Crome, MD   Other Instructions Sacubitril; Valsartan Oral Tablets What is this medicine? SACUBITRIL; VALSARTAN (sak UE bi tril; val SAR tan) is a combination of a neprilysin inhibitor and a an angiotensin II receptor blocker. It treats heart failure. This medicine may be used for other purposes; ask your health care  provider or pharmacist if you have questions. COMMON BRAND NAME(S): Entresto What should I tell my health care provider before I take this medicine? They need to know if you have any of these conditions:  diabetes and take a medicine that contains aliskiren  kidney disease  liver disease  an unusual or allergic reaction to sacubitril; valsartan, drugs called angiotensin converting enzyme (ACE) inhibitors, angiotensin II receptor blockers (ARBs), other medicines, foods, dyes, or preservatives  pregnant or trying to get pregnant  breast-feeding How should I use this medicine? Take this drug by mouth. Take it as directed on the prescription label at the same time every day. You can take it with or without food. If it upsets your stomach, take it with food. Keep taking it unless your health care provider tells you to stop. Talk to your health care provider about the use of this drug in children. While it may be prescribed for children as young as 1 for selected conditions, precautions do apply. Overdosage: If you think you have taken too much of this medicine contact a poison control center or emergency room at once. NOTE: This medicine is only for you. Do not share this medicine with others. What if I miss a dose? If you miss a dose, take it as soon as you can. If it is almost time for your next dose, take only that dose. Do not take double or extra doses. What may interact with this medicine? Do not take this medicine with any of the following medicines:  aliskiren if you have  diabetes  angiotensin-converting enzyme (ACE) inhibitors, like benazepril, captopril, enalapril, fosinopril, lisinopril, or ramipril This medicine may also interact with the following medicines:  angiotensin II receptor blockers (ARBs) like azilsartan, candesartan, eprosartan, irbesartan, losartan, olmesartan, telmisartan, or valsartan  lithium  NSAIDS, medicines for pain and inflammation, like ibuprofen or  naproxen  potassium-sparing diuretics like amiloride, spironolactone, and triamterene  potassium supplements This list may not describe all possible interactions. Give your health care provider a list of all the medicines, herbs, non-prescription drugs, or dietary supplements you use. Also tell them if you smoke, drink alcohol, or use illegal drugs. Some items may interact with your medicine. What should I watch for while using this medicine? Tell your doctor or healthcare professional if your symptoms do not start to get better or if they get worse. Do not become pregnant while taking this medicine. Women should inform their doctor if they wish to become pregnant or think they might be pregnant. There is a potential for serious side effects to an unborn child. Talk to your health care professional or pharmacist for more information. You may get dizzy. Do not drive, use machinery, or do anything that needs mental alertness until you know how this medicine affects you. Do not stand or sit up quickly, especially if you are an older patient. This reduces the risk of dizzy or fainting spells. Avoid alcoholic drinks; they can make you more dizzy. What side effects may I notice from receiving this medicine? Side effects that you should report to your doctor or health care professional as soon as possible:  allergic reactions like skin rash, itching or hives, swelling of the face, lips, or tongue  signs and symptoms of increased potassium like muscle weakness; chest pain; or fast, irregular heartbeat  signs and symptoms of kidney injury like trouble passing urine or change in the amount of urine  signs and symptoms of low blood pressure like feeling dizzy or lightheaded, or if you develop extreme fatigue Side effects that usually do not require medical attention (report to your doctor or health care professional if they continue or are bothersome):  cough This list may not describe all possible side  effects. Call your doctor for medical advice about side effects. You may report side effects to FDA at 1-800-FDA-1088. Where should I keep my medicine? Keep out of the reach of children and pets. Store at room temperature between 20 and 25 degrees C (68 and 77 degrees F). Protect from moisture. Keep the container tightly closed. Throw away any unused drug after the expiration date. NOTE: This sheet is a summary. It may not cover all possible information. If you have questions about this medicine, talk to your doctor, pharmacist, or health care provider.  2020 Elsevier/Gold Standard (2019-03-22 16:03:07)

## 2020-03-05 NOTE — Progress Notes (Signed)
Cardiology Office Note:    Date:  03/05/2020   ID:  Phillip Henry, DOB Jan 20, 1961, MRN [id number]  PCP:  Phillip Miyamoto, MD  Cardiologist:  Phillip Brothers, MD   Referring MD: Phillip Henry,*    ASSESSMENT:    1. Cardiomyopathy, unspecified type (HCC)   2. Mixed hyperlipidemia    PLAN:    In order of problems listed above:  1. Primary prevention stressed with the patient.  Importance of compliance with diet medication stressed and he vocalized understanding 2. Cardiomyopathy: Echo findings for the visit and I discussed this with him at length.  We will give him samples of Entresto low-dose and he will get his prescription from the pharmacy according to the patient.  He will come back for a Chem-7 in about 8 days or so.Patient will be seen in follow-up appointment in 3 months or earlier if the patient has any concerns 3. Patient had multiple questions which were answered to his satisfaction.   Medication Adjustments/Labs and Tests Ordered: Current medicines are reviewed at length with the patient today.  Concerns regarding medicines are outlined above.  No orders of the defined types were placed in this encounter.  No orders of the defined types were placed in this encounter.    No chief complaint on file.    History of Present Illness:    Phillip Henry is a 59 y.o. male.  Patient was evaluated by me and found to have cardiomyopathy with mildly reduced ejection fraction.  He was prescribed Entresto but he has not started it because he did not receive a prescription in the mail.  No chest pain orthopnea or PND.  At the time of my evaluation, the patient is alert awake oriented and in no distress.  He is an active gentleman.  Past Medical History:  Diagnosis Date  . Acholic stool 12/25/2019  . Cardiac murmur 01/02/2020  . Dyslipidemia 01/03/2014  . Fatigue 12/25/2019  . History of Lyme disease 01/02/2020  . Lyme disease 2017  . Mixed hyperlipidemia 12/25/2019  .  Palpitations 01/02/2020  . Premature ventricular contraction 01/03/2014    History reviewed. No pertinent surgical history.  Current Medications: Current Meds  Medication Sig  . atorvastatin (LIPITOR) 10 MG tablet Take 1 tablet (10 mg total) by mouth daily.  . sacubitril-valsartan (ENTRESTO) 24-26 MG Take 1 tablet by mouth 2 (two) times daily.     Allergies:   Patient has no known allergies.   Social History   Socioeconomic History  . Marital status: Married    Spouse name: Not on file  . Number of children: Not on file  . Years of education: Not on file  . Highest education level: Not on file  Occupational History  . Not on file  Tobacco Use  . Smoking status: Never Smoker  . Smokeless tobacco: Never Used  Substance and Sexual Activity  . Alcohol use: Never  . Drug use: Never  . Sexual activity: Not on file  Other Topics Concern  . Not on file  Social History Narrative  . Not on file   Social Determinants of Health   Financial Resource Strain:   . Difficulty of Paying Living Expenses:   Food Insecurity:   . Worried About Programme researcher, broadcasting/film/video in the Last Year:   . Barista in the Last Year:   Transportation Needs:   . Freight forwarder (Medical):   Marland Kitchen Lack of Transportation (Non-Medical):   Physical Activity:   .  Days of Exercise per Week:   . Minutes of Exercise per Session:   Stress:   . Feeling of Stress :   Social Connections:   . Frequency of Communication with Friends and Family:   . Frequency of Social Gatherings with Friends and Family:   . Attends Religious Services:   . Active Member of Clubs or Organizations:   . Attends Banker Meetings:   Marland Kitchen Marital Status:      Family History: The patient's family history includes Heart failure in his father; Rheum arthritis in his mother.  ROS:   Please see the history of present illness.    All other systems reviewed and are negative.  EKGs/Labs/Other Studies Reviewed:    The  following studies were reviewed today: I discussed my findings with the patient at length including Chem-7   Recent Labs: 12/25/2019: ALT 17; Hemoglobin 14.5; Platelets 237; TSH 1.920 02/14/2020: BUN 17; Creatinine, Ser 1.03; Potassium 4.3; Sodium 143  Recent Lipid Panel    Component Value Date/Time   CHOL 178 12/25/2019 1550   TRIG 113 12/25/2019 1550   HDL 68 12/25/2019 1550   CHOLHDL 2.6 12/25/2019 1550   LDLCALC 90 12/25/2019 1550    Physical Exam:    VS:  BP 124/70   Pulse 72   Ht 6\' 1"  (1.854 m)   Wt 165 lb (74.8 kg)   SpO2 98%   BMI 21.77 kg/m     Wt Readings from Last 3 Encounters:  03/05/20 165 lb (74.8 kg)  02/14/20 162 lb 6.4 oz (73.7 kg)  01/02/20 163 lb (73.9 kg)     GEN: Patient is in no acute distress HEENT: Normal NECK: No JVD; No carotid bruits LYMPHATICS: No lymphadenopathy CARDIAC: Hear sounds regular, 2/6 systolic murmur at the apex. RESPIRATORY:  Clear to auscultation without rales, wheezing or rhonchi  ABDOMEN: Soft, non-tender, non-distended MUSCULOSKELETAL:  No edema; No deformity  SKIN: Warm and dry NEUROLOGIC:  Alert and oriented x 3 PSYCHIATRIC:  Normal affect   Signed, 03/03/20, MD  03/05/2020 1:32 PM    [facility]

## 2020-03-14 ENCOUNTER — Telehealth (HOSPITAL_COMMUNITY): Payer: Self-pay | Admitting: *Deleted

## 2020-03-14 NOTE — Telephone Encounter (Signed)
Attempted to call patient regarding upcoming cardiac CT appointment. Left message on voicemail with name and callback number  Keiden Deskin Tai RN Navigator Cardiac Imaging [facility] [phone] Office [phone] Cell  

## 2020-03-15 ENCOUNTER — Ambulatory Visit (HOSPITAL_COMMUNITY)
Admission: RE | Admit: 2020-03-15 | Discharge: 2020-03-15 | Disposition: A | Discharge status: Home / Self Care | Payer: BC Managed Care – PPO | Source: Ambulatory Visit | Attending: Cardiology | Admitting: Cardiology

## 2020-03-15 ENCOUNTER — Other Ambulatory Visit: Payer: Self-pay

## 2020-03-15 DIAGNOSIS — I428 Other cardiomyopathies: Secondary | ICD-10-CM | POA: Diagnosis not present

## 2020-03-15 MED ORDER — SODIUM CHLORIDE 0.9 % IV BOLUS
250.0000 mL | Freq: Once | INTRAVENOUS | Status: AC
  Administered 2020-03-15: 250 mL via INTRAVENOUS

## 2020-03-15 MED ORDER — IOHEXOL 350 MG/ML SOLN
80.0000 mL | Freq: Once | INTRAVENOUS | Status: AC | PRN
  Administered 2020-03-15: 80 mL via INTRAVENOUS

## 2020-03-15 MED ORDER — NITROGLYCERIN 0.4 MG SL SUBL
0.4000 mg | SUBLINGUAL_TABLET | Freq: Once | SUBLINGUAL | Status: AC
  Administered 2020-03-15: 0.4 mg via SUBLINGUAL

## 2020-03-15 MED ORDER — NITROGLYCERIN 0.4 MG SL SUBL
SUBLINGUAL_TABLET | SUBLINGUAL | Status: AC
  Filled 2020-03-15: qty 1

## 2020-03-20 MED ORDER — ASPIRIN EC 81 MG PO TBEC
81.0000 mg | DELAYED_RELEASE_TABLET | Freq: Every day | ORAL | 3 refills | Status: DC
Start: 2020-03-20 — End: 2021-12-19

## 2020-03-20 MED ORDER — NITROGLYCERIN 0.4 MG SL SUBL
0.4000 mg | SUBLINGUAL_TABLET | SUBLINGUAL | 6 refills | Status: DC | PRN
Start: 2020-03-20 — End: 2021-12-19

## 2020-04-01 ENCOUNTER — Other Ambulatory Visit: Payer: Self-pay

## 2020-04-01 ENCOUNTER — Ambulatory Visit: Payer: BC Managed Care – PPO | Admitting: Cardiology

## 2020-04-01 ENCOUNTER — Encounter: Payer: Self-pay | Admitting: Cardiology

## 2020-04-01 VITALS — BP 113/69 | HR 61 | Ht 73.0 in | Wt 165.0 lb

## 2020-04-01 DIAGNOSIS — E785 Hyperlipidemia, unspecified: Secondary | ICD-10-CM

## 2020-04-01 DIAGNOSIS — I429 Cardiomyopathy, unspecified: Secondary | ICD-10-CM | POA: Diagnosis not present

## 2020-04-01 DIAGNOSIS — I2510 Atherosclerotic heart disease of native coronary artery without angina pectoris: Secondary | ICD-10-CM | POA: Insufficient documentation

## 2020-04-01 DIAGNOSIS — R011 Cardiac murmur, unspecified: Secondary | ICD-10-CM

## 2020-04-01 HISTORY — DX: Atherosclerotic heart disease of native coronary artery without angina pectoris: I25.10

## 2020-04-01 NOTE — Progress Notes (Signed)
Cardiology Office Note:    Date:  04/01/2020   ID:  Cleland Simkins, DOB 26-Aug-1961, MRN [id number]  PCP:  Abigail Miyamoto, MD  Cardiologist:  Garwin Brothers, MD   Referring MD: Abigail Miyamoto,*    ASSESSMENT:    No diagnosis found. PLAN:    In order of problems listed above:  1. Coronary artery disease: Secondary prevention stressed with the patient.  Importance of compliance with diet medication stressed and he vocalized understanding.  Importance of regular exercise stressed and I congratulated him on his program. 2. Mild cardiomyopathy: On Entresto now.  Tolerating well and we will do a Chem-7 today.  He will have echocardiogram in 4 months before his next appointment. 3. Mixed dyslipidemia: Diet was emphasized.  Patient on statin therapy. 4. Patient will be seen in follow-up appointment in 4 months or earlier if the patient has any concerns    Medication Adjustments/Labs and Tests Ordered: Current medicines are reviewed at length with the patient today.  Concerns regarding medicines are outlined above.  No orders of the defined types were placed in this encounter.  No orders of the defined types were placed in this encounter.    No chief complaint on file.    History of Present Illness:    Phillip Henry is a 59 y.o. male.  Patient has past medical history of mildly depressed ejection fraction and no CT coronary angiography has revealed significant coronary calcification with nonobstructive disease.  She denies any problems at this time and takes care of activities of daily living.  He walks 2 miles a regular basis.  No chest pain orthopnea or PND.  At the time of my evaluation, the patient is alert awake oriented and in no distress.  He has only started the [place] recently.  Past Medical History:  Diagnosis Date  . Acholic stool 12/25/2019  . Cardiac murmur 01/02/2020  . Dyslipidemia 01/03/2014  . Fatigue 12/25/2019  . History of Lyme disease 01/02/2020  .  Lyme disease 2017  . Mixed hyperlipidemia 12/25/2019  . Palpitations 01/02/2020  . Premature ventricular contraction 01/03/2014    History reviewed. No pertinent surgical history.  Current Medications: Current Meds  Medication Sig  . aspirin EC 81 MG tablet Take 1 tablet (81 mg total) by mouth daily. Swallow whole.  Marland Kitchen atorvastatin (LIPITOR) 10 MG tablet Take 1 tablet (10 mg total) by mouth daily.  . nitroGLYCERIN (NITROSTAT) 0.4 MG SL tablet Place 1 tablet (0.4 mg total) under the tongue every 5 (five) minutes as needed.  . sacubitril-valsartan (ENTRESTO) 24-26 MG Take 1 tablet by mouth 2 (two) times daily.     Allergies:   Patient has no known allergies.   Social History   Socioeconomic History  . Marital status: Married    Spouse name: Not on file  . Number of children: Not on file  . Years of education: Not on file  . Highest education level: Not on file  Occupational History  . Not on file  Tobacco Use  . Smoking status: Never Smoker  . Smokeless tobacco: Never Used  Substance and Sexual Activity  . Alcohol use: Never  . Drug use: Never  . Sexual activity: Not on file  Other Topics Concern  . Not on file  Social History Narrative  . Not on file   Social Determinants of Health   Financial Resource Strain:   . Difficulty of Paying Living Expenses:   Food Insecurity:   . Worried About Radiation protection practitioner  of Food in the Last Year:   . Ran Out of Food in the Last Year:   Transportation Needs:   . Lack of Transportation (Medical):   Marland Kitchen Lack of Transportation (Non-Medical):   Physical Activity:   . Days of Exercise per Week:   . Minutes of Exercise per Session:   Stress:   . Feeling of Stress :   Social Connections:   . Frequency of Communication with Friends and Family:   . Frequency of Social Gatherings with Friends and Family:   . Attends Religious Services:   . Active Member of Clubs or Organizations:   . Attends Banker Meetings:   Marland Kitchen Marital Status:        Family History: The patient's family history includes Heart failure in his father; Rheum arthritis in his mother.  ROS:   Please see the history of present illness.    All other systems reviewed and are negative.  EKGs/Labs/Other Studies Reviewed:    The following studies were reviewed today: CT CORONARY MORPH W/CTA COR W/SCORE W/CA W/CM &/OR WO/CM (Accession [id number]) (Order [id number]) Imaging Date: 03/05/2020 Department: [facility] Released By/Authorizing: Jnai Snellgrove, Aundra Dubin, MD  Exam Status  Status  Final [99]  PACS Intelerad Image Link  Show images for CT CORONARY MORPH W/CTA COR W/SCORE W/CA W/CM &/OR WO/CM Addendum    ADDENDUM REPORT: 03/18/2020 14:52  EXAM: Cardiac/Coronary  CT  TECHNIQUE: The patient was scanned on a [employer].  FINDINGS: A 120 kV prospective scan was triggered in the descending thoracic aorta at 111 HU's. Axial non-contrast 3 mm slices were carried out through the heart. The data set was analyzed on a dedicated work station and scored using the Agatson method. Gantry rotation speed was 250 msecs and collimation was .6 mm. No beta blockade and 0.8 mg of sl NTG was given. The 3D data set was reconstructed in 5% intervals of the 67-82 % of the R-R cycle. Diastolic phases were analyzed on a dedicated work station using MPR, MIP and VRT modes. The patient received 80 cc of contrast.  Aorta: Normal size. Ascending aorta 3.4 cm. No calcifications. No dissection.  Aortic Valve:  Trileaflet.  No calcifications.  Coronary Arteries:  Normal coronary origin.  Right dominance.  RCA is a large dominant artery that gives rise to PDA and two PLV branches. There is no plaque.  Left main is a large artery that gives rise to LAD, RI, and LCX arteries.  LAD is a large vessel that has minimal (<25%) calcified plaque in the proximal to mid vessel.  RI branches and the is moderate (50-69%) calcified  proximally.  LCX is a non-dominant artery that gives rise to one large OM1 branch. There is mild (25-49%) calcified plaque at the ostial LCX.  Other findings:  Normal pulmonary vein drainage into the left atrium.  Normal let atrial appendage without a thrombus.  Normal size of the pulmonary artery.  IMPRESSION: 1. Coronary calcium score of 32.8. This was 51st percentile for age and sex matched control.  2. Normal coronary origin with right dominance.  3. No evidence of obstructive CAD.  Chilton Si, MD   Electronically Signed   By: Chilton Si   On: 03/18/2020 14:52       Recent Labs: 12/25/2019: ALT 17; Hemoglobin 14.5; Platelets 237; TSH 1.920 02/14/2020: BUN 17; Creatinine, Ser 1.03; Potassium 4.3; Sodium 143  Recent Lipid Panel    Component Value Date/Time   CHOL 178 12/25/2019  1550   TRIG 113 12/25/2019 1550   HDL 68 12/25/2019 1550   CHOLHDL 2.6 12/25/2019 1550   LDLCALC 90 12/25/2019 1550    Physical Exam:    VS:  BP 113/69   Pulse 61   Ht 6\' 1"  (1.854 m)   Wt 165 lb (74.8 kg)   SpO2 97%   BMI 21.77 kg/m     Wt Readings from Last 3 Encounters:  04/01/20 165 lb (74.8 kg)  03/05/20 165 lb (74.8 kg)  02/14/20 162 lb 6.4 oz (73.7 kg)     GEN: Patient is in no acute distress HEENT: Normal NECK: No JVD; No carotid bruits LYMPHATICS: No lymphadenopathy CARDIAC: Hear sounds regular, 2/6 systolic murmur at the apex. RESPIRATORY:  Clear to auscultation without rales, wheezing or rhonchi  ABDOMEN: Soft, non-tender, non-distended MUSCULOSKELETAL:  No edema; No deformity  SKIN: Warm and dry NEUROLOGIC:  Alert and oriented x 3 PSYCHIATRIC:  Normal affect   Signed, 02/16/20, MD  04/01/2020 4:28 PM    [facility]

## 2020-04-01 NOTE — Patient Instructions (Signed)
Medication Instructions:  No medication changes. *If you need a refill on your cardiac medications before your next appointment, please call your pharmacy*   Lab Work: Your physician recommends that you have a BMET today in the office.  If you have labs (blood work) drawn today and your tests are completely normal, you will receive your results only by: Marland Kitchen MyChart Message (if you have MyChart) OR . A paper copy in the mail If you have any lab test that is abnormal or we need to change your treatment, we will call you to review the results.   Testing/Procedures: Your physician has requested that you have an echocardiogram. Echocardiography is a painless test that uses sound waves to create images of your heart. It provides your doctor with information about the size and shape of your heart and how well your heart's chambers and valves are working. This procedure takes approximately one hour. There are no restrictions for this procedure.    Follow-Up: At [facility], you and your health needs are our priority.  As part of our continuing mission to provide you with exceptional heart care, we have created designated Provider Care Teams.  These Care Teams include your primary Cardiologist (physician) and Advanced Practice Providers (APPs -  Physician Assistants and Nurse Practitioners) who all work together to provide you with the care you need, when you need it.  We recommend signing up for the patient portal called "MyChart".  Sign up information is provided on this After Visit Summary.  MyChart is used to connect with patients for Virtual Visits (Telemedicine).  Patients are able to view lab/test results, encounter notes, upcoming appointments, etc.  Non-urgent messages can be sent to your provider as well.   To learn more about what you can do with MyChart, go to [email or web address].    Your next appointment:   4 month(s)  The format for your next appointment:   In  Person  Provider:   Belva Crome, MD   Other Instructions  Echocardiogram An echocardiogram is a procedure that uses painless sound waves (ultrasound) to produce an image of the heart. Images from an echocardiogram can provide important information about:  Signs of coronary artery disease (CAD).  Aneurysm detection. An aneurysm is a weak or damaged part of an artery wall that bulges out from the normal force of blood pumping through the body.  Heart size and shape. Changes in the size or shape of the heart can be associated with certain conditions, including heart failure, aneurysm, and CAD.  Heart muscle function.  Heart valve function.  Signs of a past heart attack.  Fluid buildup around the heart.  Thickening of the heart muscle.  A tumor or infectious growth around the heart valves. Tell a health care provider about:  Any allergies you have.  All medicines you are taking, including vitamins, herbs, eye drops, creams, and over-the-counter medicines.  Any blood disorders you have.  Any surgeries you have had.  Any medical conditions you have.  Whether you are pregnant or may be pregnant. What are the risks? Generally, this is a safe procedure. However, problems may occur, including:  Allergic reaction to dye (contrast) that may be used during the procedure. What happens before the procedure? No specific preparation is needed. You may eat and drink normally. What happens during the procedure?   An IV tube may be inserted into one of your veins.  You may receive contrast through this tube. A contrast is an injection that improves  the quality of the pictures from your heart.  A gel will be applied to your chest.  A wand-like tool (transducer) will be moved over your chest. The gel will help to transmit the sound waves from the transducer.  The sound waves will harmlessly bounce off of your heart to allow the heart images to be captured in real-time motion.  The images will be recorded on a computer. The procedure may vary among health care providers and hospitals. What happens after the procedure?  You may return to your normal, everyday life, including diet, activities, and medicines, unless your health care provider tells you not to do that. Summary  An echocardiogram is a procedure that uses painless sound waves (ultrasound) to produce an image of the heart.  Images from an echocardiogram can provide important information about the size and shape of your heart, heart muscle function, heart valve function, and fluid buildup around your heart.  You do not need to do anything to prepare before this procedure. You may eat and drink normally.  After the echocardiogram is completed, you may return to your normal, everyday life, unless your health care provider tells you not to do that. This information is not intended to replace advice given to you by your health care provider. Make sure you discuss any questions you have with your health care provider. Document Revised: 12/08/2018 Document Reviewed: 09/19/2016 Elsevier Patient Education  [place].

## 2020-04-02 LAB — BASIC METABOLIC PANEL
BUN/Creatinine Ratio: 15 (ref 10–24)
BUN: 17 mg/dL (ref 8–27)
CO2: 26 mmol/L (ref 20–29)
Calcium: 9.5 mg/dL (ref 8.6–10.2)
Chloride: 103 mmol/L (ref 96–106)
Creatinine, Ser: 1.15 mg/dL (ref 0.76–1.27)
GFR calc Af Amer: 80 mL/min/{1.73_m2} (ref >59)
GFR calc non Af Amer: 69 mL/min/{1.73_m2} (ref >59)
Glucose: 88 mg/dL (ref 65–99)
Potassium: 4.7 mmol/L (ref 3.5–5.2)
Sodium: 141 mmol/L (ref 134–144)

## 2020-04-05 ENCOUNTER — Ambulatory Visit: Payer: BC Managed Care – PPO | Admitting: Cardiology

## 2020-04-11 ENCOUNTER — Ambulatory Visit: Payer: BC Managed Care – PPO | Admitting: Cardiology

## 2020-06-05 ENCOUNTER — Ambulatory Visit: Payer: BC Managed Care – PPO | Admitting: Cardiology

## 2020-06-19 ENCOUNTER — Other Ambulatory Visit: Payer: Self-pay | Admitting: Cardiology

## 2020-07-19 ENCOUNTER — Other Ambulatory Visit: Payer: Self-pay

## 2020-07-19 ENCOUNTER — Ambulatory Visit (INDEPENDENT_AMBULATORY_CARE_PROVIDER_SITE_OTHER): Payer: BC Managed Care – PPO

## 2020-07-19 DIAGNOSIS — I429 Cardiomyopathy, unspecified: Secondary | ICD-10-CM | POA: Diagnosis not present

## 2020-07-19 DIAGNOSIS — R011 Cardiac murmur, unspecified: Secondary | ICD-10-CM | POA: Diagnosis not present

## 2020-07-19 LAB — ECHOCARDIOGRAM COMPLETE
Area-P 1/2: 3.37 cm2
S' Lateral: 3.3 cm

## 2020-07-19 NOTE — Progress Notes (Signed)
Complete echocardiogram has been performed.  Jimmy Gifford Ballon RDCS, RVT 

## 2020-08-01 ENCOUNTER — Other Ambulatory Visit: Payer: Self-pay

## 2020-08-01 ENCOUNTER — Encounter: Payer: Self-pay | Admitting: Cardiology

## 2020-08-01 ENCOUNTER — Ambulatory Visit: Payer: BC Managed Care – PPO | Admitting: Cardiology

## 2020-08-01 VITALS — BP 123/70 | HR 66 | Ht 73.0 in | Wt 170.0 lb

## 2020-08-01 DIAGNOSIS — E782 Mixed hyperlipidemia: Secondary | ICD-10-CM

## 2020-08-01 DIAGNOSIS — Z8679 Personal history of other diseases of the circulatory system: Secondary | ICD-10-CM

## 2020-08-01 DIAGNOSIS — I429 Cardiomyopathy, unspecified: Secondary | ICD-10-CM

## 2020-08-01 NOTE — Progress Notes (Signed)
Cardiology Office Note:    Date:  08/01/2020   ID:  Phillip Henry, DOB December 11, 1960, MRN [id number]  PCP:  Abigail Miyamoto, MD  Cardiologist:  Garwin Brothers, MD   Referring MD: Abigail Miyamoto,*    ASSESSMENT:    1. Cardiomyopathy, unspecified type (HCC)   2. History of cardiomyopathy   3. Mixed hyperlipidemia    PLAN:    In order of problems listed above:  1. Primary prevention stressed with the patient. Importance of compliance with diet medication stressed any vocalized understanding. He was advised to continue exercising on a regular basis and is happy doing it. 2. History of cardiomyopathy: Patient is happy to know that his ejection fraction is now within normal limits. He has responded well to [facility] therapy. He will have Chem-7 today. Mixed dyslipidemia: Diet was emphasized. Lipids from last evaluation from [facility] sheet were reviewed and I discussed this with him at length. 3. Patient will be seen in follow-up appointment in 6 months or earlier if the patient has any concerns   Medication Adjustments/Labs and Tests Ordered: Current medicines are reviewed at length with the patient today.  Concerns regarding medicines are outlined above.  Orders Placed This Encounter  Procedures   Basic metabolic panel   No orders of the defined types were placed in this encounter.    No chief complaint on file.    History of Present Illness:    Phillip Henry is a 59 y.o. male patient. Patient has past medical history of essential hypertension and dyslipidemia. He denies any problems at this time and takes care of activities of daily living. No chest pain orthopnea or PND. At the time of my evaluation, the patient is alert awake oriented and in no distress.  Past Medical History:  Diagnosis Date   Acholic stool 12/25/2019   CAD (coronary artery disease) 04/01/2020   Cardiac murmur 01/02/2020   Cardiomyopathy (HCC) 02/14/2020   Dyslipidemia 01/03/2014   Fatigue  12/25/2019   History of Lyme disease 01/02/2020   Lyme disease 2017   Mixed hyperlipidemia 12/25/2019   Palpitations 01/02/2020   Premature ventricular contraction 01/03/2014    Past Surgical History:  Procedure Laterality Date   NO PAST SURGERIES      Current Medications: Current Meds  Medication Sig   aspirin EC 81 MG tablet Take 1 tablet (81 mg total) by mouth daily. Swallow whole.   ENTRESTO 24-26 MG TAKE 1 TABLET BY MOUTH 2 (TWO) TIMES DAILY.     Allergies:   Patient has no known allergies.   Social History   Socioeconomic History   Marital status: Married    Spouse name: Not on file   Number of children: Not on file   Years of education: Not on file   Highest education level: Not on file  Occupational History   Not on file  Tobacco Use   Smoking status: Never Smoker   Smokeless tobacco: Never Used  Substance and Sexual Activity   Alcohol use: Never   Drug use: Never   Sexual activity: Not on file  Other Topics Concern   Not on file  Social History Narrative   Not on file   Social Determinants of Health   Financial Resource Strain:    Difficulty of Paying Living Expenses: Not on file  Food Insecurity:    Worried About Running Out of Food in the Last Year: Not on file   Ran Out of Food in the Last Year: Not on file  Transportation Needs:    Freight forwarder (Medical): Not on file   Lack of Transportation (Non-Medical): Not on file  Physical Activity:    Days of Exercise per Week: Not on file   Minutes of Exercise per Session: Not on file  Stress:    Feeling of Stress : Not on file  Social Connections:    Frequency of Communication with Friends and Family: Not on file   Frequency of Social Gatherings with Friends and Family: Not on file   Attends Religious Services: Not on file   Active Member of Clubs or Organizations: Not on file   Attends Banker Meetings: Not on file   Marital Status: Not on file       Family History: The patient's family history includes Heart failure in his father; Rheum arthritis in his mother.  ROS:   Please see the history of present illness.    All other systems reviewed and are negative.  EKGs/Labs/Other Studies Reviewed:    The following studies were reviewed today: I discussed my findings with the patient at extensive length. Last echocardiogram there was normal ejection fraction and is happy about it.   Recent Labs: 12/25/2019: ALT 17; Hemoglobin 14.5; Platelets 237; TSH 1.920 04/01/2020: BUN 17; Creatinine, Ser 1.15; Potassium 4.7; Sodium 141  Recent Lipid Panel    Component Value Date/Time   CHOL 178 12/25/2019 1550   TRIG 113 12/25/2019 1550   HDL 68 12/25/2019 1550   CHOLHDL 2.6 12/25/2019 1550   LDLCALC 90 12/25/2019 1550    Physical Exam:    VS:  BP 123/70    Pulse 66    Ht 6\' 1"  (1.854 m)    Wt 170 lb (77.1 kg)    SpO2 98%    BMI 22.43 kg/m     Wt Readings from Last 3 Encounters:  08/01/20 170 lb (77.1 kg)  04/01/20 165 lb (74.8 kg)  03/05/20 165 lb (74.8 kg)     GEN: Patient is in no acute distress HEENT: Normal NECK: No JVD; No carotid bruits LYMPHATICS: No lymphadenopathy CARDIAC: Hear sounds regular, 2/6 systolic murmur at the apex. RESPIRATORY:  Clear to auscultation without rales, wheezing or rhonchi  ABDOMEN: Soft, non-tender, non-distended MUSCULOSKELETAL:  No edema; No deformity  SKIN: Warm and dry NEUROLOGIC:  Alert and oriented x 3 PSYCHIATRIC:  Normal affect   Signed, 05/06/20, MD  08/01/2020 4:31 PM    [facility]

## 2020-08-01 NOTE — Patient Instructions (Signed)
Medication Instructions:  No medication changes *If you need a refill on your cardiac medications before your next appointment, please call your pharmacy*   Lab Work: Your physician recommends that you have a BMET today in the office.  If you have labs (blood work) drawn today and your tests are completely normal, you will receive your results only by: . MyChart Message (if you have MyChart) OR . A paper copy in the mail If you have any lab test that is abnormal or we need to change your treatment, we will call you to review the results.   Testing/Procedures: None ordered   Follow-Up: At [facility], you and your health needs are our priority.  As part of our continuing mission to provide you with exceptional heart care, we have created designated Provider Care Teams.  These Care Teams include your primary Cardiologist (physician) and Advanced Practice Providers (APPs -  Physician Assistants and Nurse Practitioners) who all work together to provide you with the care you need, when you need it.  We recommend signing up for the patient portal called "MyChart".  Sign up information is provided on this After Visit Summary.  MyChart is used to connect with patients for Virtual Visits (Telemedicine).  Patients are able to view lab/test results, encounter notes, upcoming appointments, etc.  Non-urgent messages can be sent to your provider as well.   To learn more about what you can do with MyChart, go to [email or web address].    Your next appointment:   6 month(s)  The format for your next appointment:   In Person  Provider:   Rajan Revankar, MD   Other Instructions NA  

## 2020-08-02 LAB — BASIC METABOLIC PANEL
BUN/Creatinine Ratio: 18 (ref 10–24)
BUN: 21 mg/dL (ref 8–27)
CO2: 27 mmol/L (ref 20–29)
Calcium: 9.4 mg/dL (ref 8.6–10.2)
Chloride: 103 mmol/L (ref 96–106)
Creatinine, Ser: 1.16 mg/dL (ref 0.76–1.27)
GFR calc Af Amer: 79 mL/min/{1.73_m2} (ref >59)
GFR calc non Af Amer: 68 mL/min/{1.73_m2} (ref >59)
Glucose: 91 mg/dL (ref 65–99)
Potassium: 4.3 mmol/L (ref 3.5–5.2)
Sodium: 142 mmol/L (ref 134–144)

## 2021-09-07 IMAGING — CT CT HEART MORP W/ CTA COR W/ SCORE W/ CA W/CM &/OR W/O CM
2 of 8 series · 4 of 20 positions shown, 5 images · non-contrast
Comparison: 01/15/2020
COMPARISON: 01/15/2020

Addendum:
EXAM:
OVER-READ INTERPRETATION  CT CHEST

The following report is an over-read performed by radiologist Dr.
Phan Bocanegra [REDACTED] on 03/15/2020. This over-read
does not include interpretation of cardiac or coronary anatomy or
pathology. The coronary CTA interpretation by the cardiologist is
attached.
TECHNIQUE: The patient was scanned on a [employer] scanner.

[Series 12: best syst. · axial · 0.39mm/px · z∈[+14,+62]mm · 2 of 361 slices shown, 3 images]
[im 121/361  vessel]
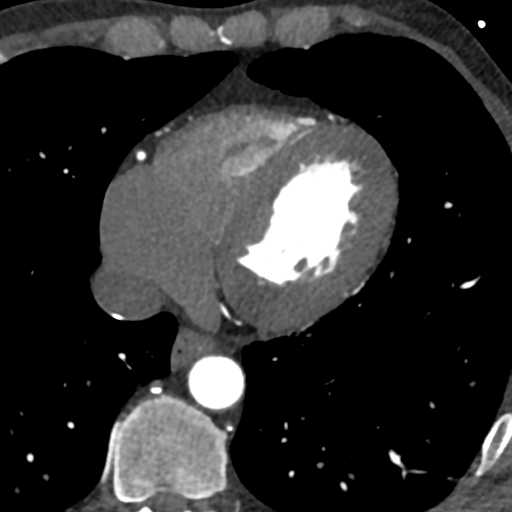
[im 121/361  lung]
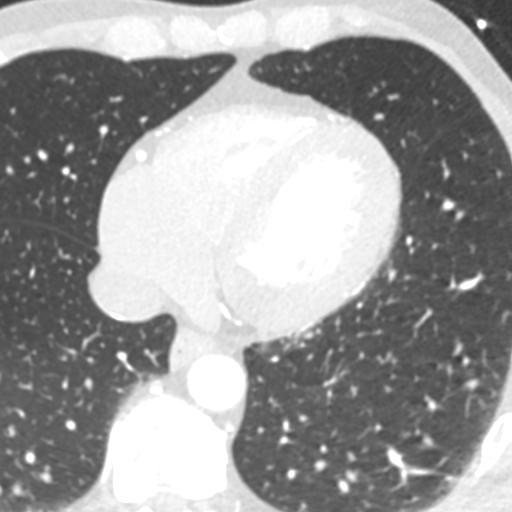
[im 241/361  vessel]
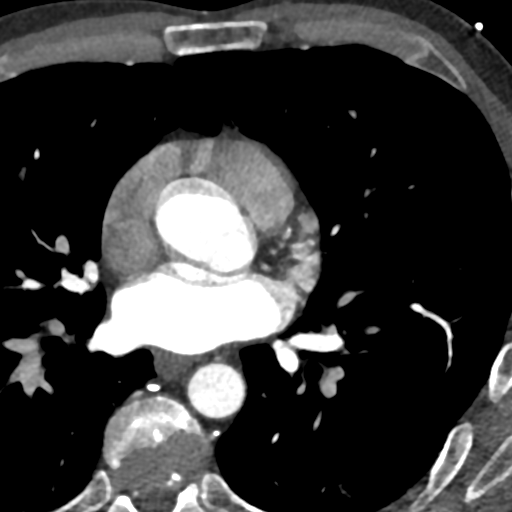

[Series 13: ts syst sharp · axial · 0.39mm/px · z∈[+14,+62]mm · 2 of 361 slices shown]
[im 121/361  lung]
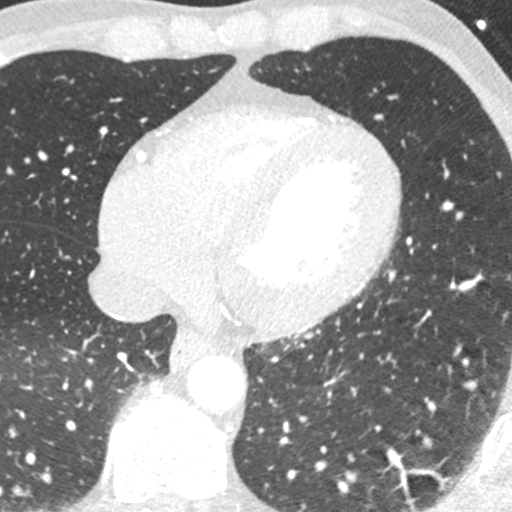
[im 241/361  lung]
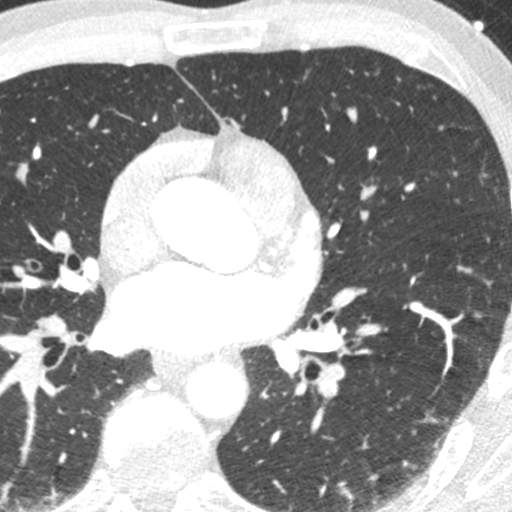

[4 of 20 positions shown; findings below may reference images not displayed]

FINDINGS: Vascular: Normal aortic caliber. No central pulmonary embolism, on
this non-dedicated study.

Mediastinum/Nodes: No imaged thoracic adenopathy.

Lungs/Pleura: No pleural fluid.  Clear imaged lungs.

Upper Abdomen: Normal imaged portions of the liver, spleen, stomach

Musculoskeletal: No acute osseous abnormality.
IMPRESSION: No acute findings in the imaged extracardiac chest.

EXAM:
Cardiac/Coronary  CT
FINDINGS: A 120 kV prospective scan was triggered in the descending thoracic
aorta at 111 HU's. Axial non-contrast 3 mm slices were carried out
through the heart. The data set was analyzed on a dedicated work
station and scored using the Agatson method. Gantry rotation speed
was 250 msecs and collimation was .6 mm. No beta blockade and 0.8 mg
of sl NTG was given. The 3D data set was reconstructed in 5%
intervals of the 67-82 % of the R-R cycle. Diastolic phases were
analyzed on a dedicated work station using MPR, MIP and VRT modes.
The patient received 80 cc of contrast.

Aorta: Normal size. Ascending aorta 3.4 cm. No calcifications. No
dissection.

Aortic Valve:  Trileaflet.  No calcifications.

Coronary Arteries:  Normal coronary origin.  Right dominance.

RCA is a large dominant artery that gives rise to PDA and two STARS
branches. There is no plaque.

Left main is a large artery that gives rise to LAD, RI, and LCX
arteries.

LAD is a large vessel that has minimal (<25%) calcified plaque in
the proximal to mid vessel.

RI branches and the is moderate (50-69%) calcified proximally.

LCX is a non-dominant artery that gives rise to one large OM1
branch. There is mild (25-49%) calcified plaque at the ostial LCX.

Other findings:

Normal pulmonary vein drainage into the left atrium.

Normal let atrial appendage without a thrombus.

Normal size of the pulmonary artery.
IMPRESSION: 1. Coronary calcium score of 32.8. This was 51st percentile for age
and sex matched control.

2. Normal coronary origin with right dominance.

3. No evidence of obstructive CAD.

*** End of Addendum ***
EXAM:
OVER-READ INTERPRETATION  CT CHEST

The following report is an over-read performed by radiologist Dr.
Phan Bocanegra [REDACTED] on 03/15/2020. This over-read
does not include interpretation of cardiac or coronary anatomy or
pathology. The coronary CTA interpretation by the cardiologist is
attached.
FINDINGS: Vascular: Normal aortic caliber. No central pulmonary embolism, on
this non-dedicated study.

Mediastinum/Nodes: No imaged thoracic adenopathy.

Lungs/Pleura: No pleural fluid.  Clear imaged lungs.

Upper Abdomen: Normal imaged portions of the liver, spleen, stomach

Musculoskeletal: No acute osseous abnormality.
IMPRESSION: No acute findings in the imaged extracardiac chest.

## 2021-10-29 DIAGNOSIS — G4762 Sleep related leg cramps: Secondary | ICD-10-CM | POA: Insufficient documentation

## 2021-12-19 ENCOUNTER — Ambulatory Visit (INDEPENDENT_AMBULATORY_CARE_PROVIDER_SITE_OTHER): Payer: Managed Care, Other (non HMO) | Admitting: Legal Medicine

## 2021-12-19 ENCOUNTER — Encounter: Payer: Self-pay | Admitting: Legal Medicine

## 2021-12-19 VITALS — BP 120/80 | HR 59 | Temp 97.6°F | Resp 15 | Ht 73.0 in | Wt 168.0 lb

## 2021-12-19 DIAGNOSIS — J0100 Acute maxillary sinusitis, unspecified: Secondary | ICD-10-CM | POA: Diagnosis not present

## 2021-12-19 DIAGNOSIS — E782 Mixed hyperlipidemia: Secondary | ICD-10-CM

## 2021-12-19 DIAGNOSIS — Z1211 Encounter for screening for malignant neoplasm of colon: Secondary | ICD-10-CM

## 2021-12-19 DIAGNOSIS — I429 Cardiomyopathy, unspecified: Secondary | ICD-10-CM

## 2021-12-19 DIAGNOSIS — I739 Peripheral vascular disease, unspecified: Secondary | ICD-10-CM | POA: Diagnosis not present

## 2021-12-19 MED ORDER — TRIAMCINOLONE ACETONIDE 40 MG/ML IJ SUSP
80.0000 mg | Freq: Once | INTRAMUSCULAR | Status: AC
  Administered 2021-12-19: 80 mg via INTRAMUSCULAR

## 2021-12-19 MED ORDER — AZITHROMYCIN 250 MG PO TABS: ORAL_TABLET | ORAL | 0 refills | Status: AC

## 2021-12-19 NOTE — Progress Notes (Signed)
gi ? ?Acute Office Visit ? ?Subjective:  ? ? Patient ID: Phillip Henry, male    DOB: [birth year], 61 y.o.   MRN: [id number] ? ?Chief Complaint  ?Patient presents with  ? Fatigue  ? Jaw Pain  ? ? ?WUJ:WJXBJ ?Patient is in today for pain on left sided of the jaw since 7-10 days. He also mentioned to feel tired and not any energy. He would like to have some bloodwork. Patient mentioned cramps on his heck. ? ?Cardiomyopathy, he was entresto but stopped on own, he has not seen Dr. Sheila Oats for 2 years. ? ?Patient presents with hyperlipidemia.  Compliance with treatment has been good; patient takes medicines as directed, maintains low cholesterol diet, follows up as directed, and maintains exercise regimen.  Patient is using stopped medicines without problems.  ? ?Past Medical History:  ?Diagnosis Date  ? Acholic stool 4/78/2956  ? CAD (coronary artery disease) 04/01/2020  ? Cardiac murmur 01/02/2020  ? Cardiomyopathy ([place]) 02/14/2020  ? Dyslipidemia 01/03/2014  ? Fatigue 12/25/2019  ? History of Lyme disease 01/02/2020  ? Lyme disease 2017  ? Mixed hyperlipidemia 12/25/2019  ? Palpitations 01/02/2020  ? Premature ventricular contraction 01/03/2014  ? ? ?Past Surgical History:  ?Procedure Laterality Date  ? NO PAST SURGERIES    ? ? ?Family History  ?Problem Relation Age of Onset  ? Rheum arthritis Mother   ? Heart failure Father   ? ? ?Social History  ? ?Socioeconomic History  ? Marital status: Married  ?  Spouse name: Not on file  ? Number of children: Not on file  ? Years of education: Not on file  ? Highest education level: Not on file  ?Occupational History  ? Not on file  ?Tobacco Use  ? Smoking status: Never  ? Smokeless tobacco: Never  ?Substance and Sexual Activity  ? Alcohol use: Never  ? Drug use: Never  ? Sexual activity: Not on file  ?Other Topics Concern  ? Not on file  ?Social History Narrative  ? Not on file  ? ?Social Determinants of Health  ? ?Financial Resource Strain: Not on file  ?Food Insecurity: Not on file   ?Transportation Needs: Not on file  ?Physical Activity: Not on file  ?Stress: Not on file  ?Social Connections: Not on file  ?Intimate Partner Violence: Not on file  ? ? ?Outpatient Medications Prior to Visit  ?Medication Sig Dispense Refill  ? aspirin EC 81 MG tablet Take 1 tablet (81 mg total) by mouth daily. Swallow whole. 90 tablet 3  ? atorvastatin (LIPITOR) 10 MG tablet Take 1 tablet (10 mg total) by mouth daily. 30 tablet 11  ? ENTRESTO 24-26 MG TAKE 1 TABLET BY MOUTH 2 (TWO) TIMES DAILY. 180 tablet 2  ? nitroGLYCERIN (NITROSTAT) 0.4 MG SL tablet Place 1 tablet (0.4 mg total) under the tongue every 5 (five) minutes as needed. 25 tablet 6  ? ?No facility-administered medications prior to visit.  ? ? ?Allergies  ?Allergen Reactions  ? Pollen Extract Anxiety, Cough, Dermatitis, Other (See Comments), Shortness Of Breath, Swelling and Tinitus  ? ? ?Review of Systems  ?Constitutional:  Positive for fatigue. Negative for chills, fever and unexpected weight change.  ?HENT:  Negative for congestion, ear pain, sinus pain and sore throat.   ?Respiratory:  Positive for cough. Negative for shortness of breath.   ?Cardiovascular:  Negative for chest pain and palpitations.  ?Gastrointestinal:  Negative for abdominal pain, blood in stool, constipation, diarrhea, nausea and vomiting.  ?Endocrine: Negative  for polydipsia.  ?Genitourinary:  Negative for dysuria.  ?Musculoskeletal:  Positive for arthralgias (pain on jaw left side) and neck pain (cramps). Negative for back pain.  ?Skin:  Negative for rash.  ?Neurological:  Positive for headaches.  ? ?   ?Objective:  ?  ?Physical Exam ? ?BP 120/80   Pulse (!) 59   Temp 97.6 ?F (36.4 ?C)   Resp 15   Ht 6' 1"  (1.854 m)   Wt 168 lb (76.2 kg)   SpO2 98%   BMI 22.16 kg/m?  ?Wt Readings from Last 3 Encounters:  ?12/19/21 168 lb (76.2 kg)  ?08/01/20 170 lb (77.1 kg)  ?04/01/20 165 lb (74.8 kg)  ? ? ?Health Maintenance Due  ?Topic Date Due  ? HIV Screening  Never done  ? Hepatitis  C Screening  Never done  ? COLONOSCOPY (Pts 45-2yr Insurance coverage will need to be confirmed)  Never done  ? Zoster Vaccines- Shingrix (1 of 2) Never done  ? ? ?There are no preventive care reminders to display for this patient. ? ? ?Lab Results  ?Component Value Date  ? TSH 1.920 12/25/2019  ? ?Lab Results  ?Component Value Date  ? WBC 5.5 12/19/2021  ? HGB 15.2 12/19/2021  ? HCT 44.9 12/19/2021  ? MCV 90 12/19/2021  ? PLT 236 12/19/2021  ? ?Lab Results  ?Component Value Date  ? NA 142 12/19/2021  ? K 5.2 12/19/2021  ? CO2 23 12/19/2021  ? GLUCOSE 95 12/19/2021  ? BUN 17 12/19/2021  ? CREATININE 1.14 12/19/2021  ? BILITOT 0.4 12/19/2021  ? ALKPHOS 61 12/19/2021  ? AST 17 12/19/2021  ? ALT 17 12/19/2021  ? PROT 6.6 12/19/2021  ? ALBUMIN 4.5 12/19/2021  ? CALCIUM 9.1 12/19/2021  ? EGFR 73 12/19/2021  ? ?Lab Results  ?Component Value Date  ? CHOL 228 (H) 12/19/2021  ? ?Lab Results  ?Component Value Date  ? HDL 81 12/19/2021  ? ?Lab Results  ?Component Value Date  ? LDLCALC 126 (H) 12/19/2021  ? ?Lab Results  ?Component Value Date  ? TRIG 124 12/19/2021  ? ?Lab Results  ?Component Value Date  ? CHOLHDL 2.8 12/19/2021  ? ?No results found for: HGBA1C ? ?   ?Assessment & Plan:  ? ?Problem List Items Addressed This Visit   ? ?  ? Cardiovascular and Mediastinum  ? [place]  ? Relevant Orders  ? CBC with Differential/Platelet (Completed)  ? Comprehensive metabolic panel (Completed)  ? Lipid panel (Completed) ?Patient has improved but needs follow up for cardiomyopathy  ?  ? Other  ? Mixed hyperlipidemia - Primary  ? Relevant Orders  ? Lipid panel (Completed) ?AN INDIVIDUAL CARE PLAN for hyperlipidemia/ cholesterol was established and reinforced today.  The patient's status was assessed using clinical findings on exam, lab and other diagnostic tests. The patient's disease status was assessed based on evidence-based guidelines and found to be fair controlled. ?MEDICATIONS were reviewed. ?SELF MANAGEMENT GOALS  have been discussed and patient's success at attaining the goal of low cholesterol was assessed. ?RECOMMENDATION given include regular exercise 3 days a week and low cholesterol/low fat diet. ?CLINICAL SUMMARY including written plan to identify barriers unique to the patient due to social or economic  reasons was discussed.   ? ?Other Visit Diagnoses   ? ? Acute non-recurrent maxillary sinusitis      ? Relevant Medications  ? triamcinolone acetonide (KENALOG-40) injection 80 mg (Completed)  ? azithromycin (ZITHROMAX) 250 MG tablet ?Treat sinusitis with  kenalog and z-pack  ? PAD (peripheral artery disease) ([place])      ? Relevant Orders  ? VAS [place] ABI WITH/WO TBI ?Patient is having night cramps, needs ABI  ? Colon cancer screening      ? Relevant Orders  ? Ambulatory referral to Gastroenterology  ? ?  ? ?Meds ordered this encounter  ?Medications  ? triamcinolone acetonide (KENALOG-40) injection 80 mg  ? azithromycin (ZITHROMAX) 250 MG tablet  ?  Sig: Take 2 tablets on day 1, then 1 tablet daily on days 2 through 5  ?  Dispense:  6 tablet  ?  Refill:  0  ? ? ?Orders Placed This Encounter  ?Procedures  ? CBC with Differential/Platelet  ? Comprehensive metabolic panel  ? Lipid panel  ? Cardiovascular Risk Assessment  ? Ambulatory referral to Gastroenterology  ? VAS [place] ABI WITH/WO TBI  ?  ? ?Follow-up: Return in about 6 months (around 06/20/2022) for hyperlipidemia. ? ?An After Visit Summary was printed and given to the patient. ? ?Reinaldo Meeker, MD ?[facility] ?[phone] ?

## 2021-12-20 LAB — CBC WITH DIFFERENTIAL/PLATELET
Basophils Absolute: 0 10*3/uL (ref 0.0–0.2)
Basos: 1 %
EOS (ABSOLUTE): 0.3 10*3/uL (ref 0.0–0.4)
Eos: 5 %
Hematocrit: 44.9 % (ref 37.5–51.0)
Hemoglobin: 15.2 g/dL (ref 13.0–17.7)
Immature Grans (Abs): 0 10*3/uL (ref 0.0–0.1)
Immature Granulocytes: 0 %
Lymphocytes Absolute: 1.5 10*3/uL (ref 0.7–3.1)
Lymphs: 27 %
MCH: 30.6 pg (ref 26.6–33.0)
MCHC: 33.9 g/dL (ref 31.5–35.7)
MCV: 90 fL (ref 79–97)
Monocytes Absolute: 0.6 10*3/uL (ref 0.1–0.9)
Monocytes: 11 %
Neutrophils Absolute: 3.1 10*3/uL (ref 1.4–7.0)
Neutrophils: 56 %
Platelets: 236 10*3/uL (ref 150–450)
RBC: 4.97 x10E6/uL (ref 4.14–5.80)
RDW: 13.1 % (ref 11.6–15.4)
WBC: 5.5 10*3/uL (ref 3.4–10.8)

## 2021-12-20 LAB — COMPREHENSIVE METABOLIC PANEL
ALT: 17 IU/L (ref 0–44)
AST: 17 IU/L (ref 0–40)
Albumin/Globulin Ratio: 2.1 (ref 1.2–2.2)
Albumin: 4.5 g/dL (ref 3.8–4.8)
Alkaline Phosphatase: 61 IU/L (ref 44–121)
BUN/Creatinine Ratio: 15 (ref 10–24)
BUN: 17 mg/dL (ref 8–27)
Bilirubin Total: 0.4 mg/dL (ref 0.0–1.2)
CO2: 23 mmol/L (ref 20–29)
Calcium: 9.1 mg/dL (ref 8.6–10.2)
Chloride: 105 mmol/L (ref 96–106)
Creatinine, Ser: 1.14 mg/dL (ref 0.76–1.27)
Globulin, Total: 2.1 g/dL (ref 1.5–4.5)
Glucose: 95 mg/dL (ref 70–99)
Potassium: 5.2 mmol/L (ref 3.5–5.2)
Sodium: 142 mmol/L (ref 134–144)
Total Protein: 6.6 g/dL (ref 6.0–8.5)
eGFR: 73 mL/min/{1.73_m2} (ref >59)

## 2021-12-20 LAB — LIPID PANEL
Chol/HDL Ratio: 2.8 ratio (ref 0.0–5.0)
Cholesterol, Total: 228 mg/dL — ABNORMAL HIGH (ref 100–199)
HDL: 81 mg/dL (ref >39)
LDL Chol Calc (NIH): 126 mg/dL — ABNORMAL HIGH (ref 0–99)
Triglycerides: 124 mg/dL (ref 0–149)
VLDL Cholesterol Cal: 21 mg/dL (ref 5–40)

## 2021-12-20 LAB — CARDIOVASCULAR RISK ASSESSMENT

## 2021-12-21 NOTE — Progress Notes (Signed)
LDL cholesterol 126 high, CBC normal, kidney and liver tests normal, strongly recommend DASH diet and statin to lower cardiovascular risk ?lp

## 2021-12-23 ENCOUNTER — Other Ambulatory Visit: Payer: Self-pay | Admitting: Legal Medicine

## 2021-12-23 MED ORDER — ATORVASTATIN CALCIUM 40 MG PO TABS: 40.0000 mg | ORAL_TABLET | Freq: Every day | ORAL | 3 refills | Status: AC

## 2021-12-30 ENCOUNTER — Telehealth: Payer: Self-pay | Admitting: Legal Medicine

## 2021-12-30 NOTE — Telephone Encounter (Signed)
? ?  Phillip Henry has been scheduled for the following appointment: ? ?WHAT: VASCULAR ABI ?WHERE: [place] ?DATE: 01/06/22 ?TIME: 10:30 AM CHECK IN ? ?Patient has been made aware. ? ?

## 2022-01-07 ENCOUNTER — Telehealth: Payer: Self-pay

## 2022-01-07 NOTE — Telephone Encounter (Signed)
I gave the results to patient of Vascular study of bilateral lower extremities. It was normal circulation. He asked what is the next step about the cramping problem and also he wants to know if he needs to take entresto? Please advice. ?

## 2022-01-07 NOTE — Telephone Encounter (Signed)
He needs to see Dr. Wille Glaser but I think he should restart entresto ?lp ?

## 2022-01-08 ENCOUNTER — Other Ambulatory Visit: Payer: Self-pay

## 2022-01-08 DIAGNOSIS — I739 Peripheral vascular disease, unspecified: Secondary | ICD-10-CM

## 2022-01-09 NOTE — Telephone Encounter (Signed)
Patient made an appointment on 05/16 ?

## 2022-01-13 ENCOUNTER — Encounter: Payer: Self-pay | Admitting: Legal Medicine

## 2022-01-13 ENCOUNTER — Ambulatory Visit (INDEPENDENT_AMBULATORY_CARE_PROVIDER_SITE_OTHER): Payer: Managed Care, Other (non HMO) | Admitting: Legal Medicine

## 2022-01-13 VITALS — BP 106/60 | HR 49 | Temp 98.2°F | Resp 15 | Ht 73.0 in | Wt 163.0 lb

## 2022-01-13 DIAGNOSIS — E782 Mixed hyperlipidemia: Secondary | ICD-10-CM

## 2022-01-13 DIAGNOSIS — I739 Peripheral vascular disease, unspecified: Secondary | ICD-10-CM | POA: Diagnosis not present

## 2022-01-13 DIAGNOSIS — Z6821 Body mass index (BMI) 21.0-21.9, adult: Secondary | ICD-10-CM

## 2022-01-13 NOTE — Progress Notes (Signed)
? ?Subjective:  ?Patient ID: Phillip Henry, male    DOB: 1961/08/08  Age: 61 y.o. MRN: [id number] ? ?Chief Complaint  ?Patient presents with  ? Cramping both legs  ? Discuss medicine  ?  Entresto  ? ? ?HPI: chronic visit ? Patient is here for severe cramps on both legs at night time since 2 months ago. Vascular study of bilateral lower extremities was done on 01/06/2022 and it was normal. Not seeing  cardiology since 2021, he stopped entresto. Abi normal. ?He is still having this problem and also he mentioned that his hands cramps at work sometimes.He is on statin. No side effects. ? ?Patient was putting in [place] few years ago for cardiologist. He stopped medication and he would like to understand the reason why he was putting in entresto and knows if he needs to continue and it will be not affected the cramps on his legs. He has not followed up with cardiology for his cardiomyopathy. We discussed. ?Current Outpatient Medications on File Prior to Visit  ?Medication Sig Dispense Refill  ? atorvastatin (LIPITOR) 40 MG tablet Take 1 tablet (40 mg total) by mouth daily. 90 tablet 3  ? ?No current facility-administered medications on file prior to visit.  ? ?Past Medical History:  ?Diagnosis Date  ? Acholic stool 12/25/2019  ? CAD (coronary artery disease) 04/01/2020  ? Cardiac murmur 01/02/2020  ? Cardiomyopathy ([facility]) 02/14/2020  ? Dyslipidemia 01/03/2014  ? Fatigue 12/25/2019  ? History of Lyme disease 01/02/2020  ? Lyme disease 2017  ? Mixed hyperlipidemia 12/25/2019  ? Palpitations 01/02/2020  ? Premature ventricular contraction 01/03/2014  ? ?Past Surgical History:  ?Procedure Laterality Date  ? TONSILLECTOMY  1966  ?  ?Family History  ?Problem Relation Age of Onset  ? Rheum arthritis Mother   ? Heart failure Father   ? ?Social History  ? ?Socioeconomic History  ? Marital status: Married  ?  Spouse name: Not on file  ? Number of children: Not on file  ? Years of education: Not on file  ? Highest education level: Not on file   ?Occupational History  ? Not on file  ?Tobacco Use  ? Smoking status: Never  ? Smokeless tobacco: Never  ?Vaping Use  ? Vaping Use: Never used  ?Substance and Sexual Activity  ? Alcohol use: Yes  ?  Alcohol/week: 1.0 standard drink  ?  Types: 1 Glasses of wine per week  ?  Comment: someday in the week  ? Drug use: Never  ? Sexual activity: Not Currently  ?Other Topics Concern  ? Not on file  ?Social History Narrative  ? Not on file  ? ?Social Determinants of Health  ? ?Financial Resource Strain: Not on file  ?Food Insecurity: Not on file  ?Transportation Needs: Not on file  ?Physical Activity: Not on file  ?Stress: Not on file  ?Social Connections: Not on file  ? ? ?Review of Systems  ?Constitutional:  Negative for chills, fatigue, fever and unexpected weight change.  ?HENT:  Negative for congestion, ear pain, sinus pain and sore throat.   ?Eyes:  Negative for visual disturbance.  ?Respiratory:  Negative for cough and shortness of breath.   ?Cardiovascular:  Negative for chest pain and palpitations.  ?Gastrointestinal:  Negative for abdominal pain, blood in stool, constipation, diarrhea, nausea and vomiting.  ?Endocrine: Negative for polydipsia.  ?Genitourinary:  Negative for dysuria.  ?Musculoskeletal:  Negative for back pain.  ?Skin:  Negative for rash.  ?Neurological:  Negative for weakness,  numbness and headaches.  ?     Cramping on both legs at night time and hands at work  ? ? ?Objective:  ?BP 106/60   Pulse (!) 49   Temp 98.2 ?F (36.8 ?C)   Resp 15   Ht 6\' 1"  (1.854 m)   Wt 163 lb (73.9 kg)   SpO2 100%   BMI 21.51 kg/m?  ? ? ?  01/13/2022  ?  8:33 AM 12/19/2021  ?  9:01 AM 08/01/2020  ?  4:02 PM  ?BP/Weight  ?Systolic BP 106 120 123  ?Diastolic BP 60 80 70  ?Wt. (Lbs) 163 168 170  ?BMI 21.51 kg/m2 22.16 kg/m2 22.43 kg/m2  ? ? ?Physical Exam ?Vitals reviewed.  ?Constitutional:   ?   General: He is not in acute distress. ?   Appearance: Normal appearance.  ?Cardiovascular:  ?   Rate and Rhythm: Normal rate  and regular rhythm.  ?   Pulses: Normal pulses.  ?   Heart sounds: No murmur heard. ?  No gallop.  ?   Comments: Bruit left femoral area ?Pulmonary:  ?   Effort: Pulmonary effort is normal. No respiratory distress.  ?   Breath sounds: Normal breath sounds. No wheezing.  ?Musculoskeletal:     ?   General: Normal range of motion.  ?   Right lower leg: No edema.  ?   Left lower leg: Edema present.  ?Skin: ?   Capillary Refill: Capillary refill takes less than 2 seconds.  ?Neurological:  ?   General: No focal deficit present.  ?   Mental Status: He is alert and oriented to person, place, and time. Mental status is at baseline.  ?   Gait: Gait normal.  ? ? ? ?  ? ?Lab Results  ?Component Value Date  ? WBC 5.5 12/19/2021  ? HGB 15.2 12/19/2021  ? HCT 44.9 12/19/2021  ? PLT 236 12/19/2021  ? GLUCOSE 95 12/19/2021  ? CHOL 228 (H) 12/19/2021  ? TRIG 124 12/19/2021  ? HDL 81 12/19/2021  ? LDLCALC 126 (H) 12/19/2021  ? ALT 17 12/19/2021  ? AST 17 12/19/2021  ? NA 142 12/19/2021  ? K 5.2 12/19/2021  ? CL 105 12/19/2021  ? CREATININE 1.14 12/19/2021  ? BUN 17 12/19/2021  ? CO2 23 12/19/2021  ? TSH 1.920 12/25/2019  ? ? ? ? ?Assessment & Plan:  ? ?Problem List Items Addressed This Visit   ? ?  ? Other  ? Mixed hyperlipidemia - Primary ?AN INDIVIDUAL CARE PLAN for hyperlipidemia/ cholesterol was established and reinforced today.  The patient's status was assessed using clinical findings on exam, lab and other diagnostic tests. The patient's disease status was assessed based on evidence-based guidelines and found to be fair controlled. ?MEDICATIONS were reviewed. ?SELF MANAGEMENT GOALS have been discussed and patient's success at attaining the goal of low cholesterol was assessed. ?RECOMMENDATION given include regular exercise 3 days a week and low cholesterol/low fat diet. ?CLINICAL SUMMARY including written plan to identify barriers unique to the patient due to social or economic  reasons was discussed.   ? BMI 21.0-21.9,  adult ?Weight stable  ? ?Other Visit Diagnoses   ? ? Hypomagnesemia      ? Relevant Orders  ? Magnesium ?Contiued leg cramps, check magnesium ?  ? PAD (peripheral artery disease) ([facility])      ? Relevant Orders  ? Ambulatory referral to Vascular Surgery ?Continued claudication and nocturnal cramps of recent onset.  New bruit  left femoral artery, patient needs vascular consult with normal ABI  ? ?  ?. ? ? ? ?Orders Placed This Encounter  ?Procedures  ? Magnesium  ? Ambulatory referral to Vascular Surgery  ?  ? ?Follow-up: Return in about 6 months (around 07/16/2022). ? ?An After Visit Summary was printed and given to the patient. ? ?Brent Bulla, MD ?[facility] ?[phone] ?

## 2022-01-14 LAB — MAGNESIUM: Magnesium: 2.3 mg/dL (ref 1.6–2.3)

## 2022-01-14 NOTE — Progress Notes (Signed)
Magnesium 2.3 normal- not the cause of cramping.  See vascular surgery ?lp

## 2022-01-21 DIAGNOSIS — M79605 Pain in left leg: Secondary | ICD-10-CM | POA: Insufficient documentation

## 2022-05-29 ENCOUNTER — Encounter: Payer: Self-pay | Admitting: Legal Medicine

## 2022-06-18 ENCOUNTER — Other Ambulatory Visit: Payer: Self-pay

## 2022-06-18 DIAGNOSIS — Z1211 Encounter for screening for malignant neoplasm of colon: Secondary | ICD-10-CM

## 2022-06-24 ENCOUNTER — Ambulatory Visit (INDEPENDENT_AMBULATORY_CARE_PROVIDER_SITE_OTHER): Payer: Managed Care, Other (non HMO) | Admitting: Legal Medicine

## 2022-06-24 ENCOUNTER — Encounter: Payer: Self-pay | Admitting: Legal Medicine

## 2022-06-24 VITALS — BP 100/60 | HR 58 | Temp 98.2°F | Resp 14 | Ht 73.0 in | Wt 164.0 lb

## 2022-06-24 DIAGNOSIS — I739 Peripheral vascular disease, unspecified: Secondary | ICD-10-CM

## 2022-06-24 DIAGNOSIS — I2510 Atherosclerotic heart disease of native coronary artery without angina pectoris: Secondary | ICD-10-CM | POA: Diagnosis not present

## 2022-06-24 DIAGNOSIS — Z114 Encounter for screening for human immunodeficiency virus [HIV]: Secondary | ICD-10-CM

## 2022-06-24 DIAGNOSIS — E782 Mixed hyperlipidemia: Secondary | ICD-10-CM

## 2022-06-24 DIAGNOSIS — Z1159 Encounter for screening for other viral diseases: Secondary | ICD-10-CM

## 2022-06-24 NOTE — Progress Notes (Signed)
Subjective:  Patient ID: Phillip Henry, male    DOB: [birth year]  Age: 61 y.o. MRN: [id number]  Chief Complaint  Patient presents with   Coronary Artery Disease   Hyperlipidemia    HPI  Patient presents with hyperlipidemia.  Compliance with treatment has been good; patient takes medicines as directed, maintains low cholesterol diet, follows up as directed, and maintains exercise regimen.  Patient is using Atorvastatin 40 mg daily without problems.  BP has been varying a lot, she is anxious.He gets DOE climbing stairs.  His Bpm is good, he has history of chronic Lyme- treated.  Has neck stiffness.. not having brain fog.  He was having insomnia last week worrying about symptoms.  Current Outpatient Medications on File Prior to Visit  Medication Sig Dispense Refill   atorvastatin (LIPITOR) 40 MG tablet Take 1 tablet (40 mg total) by mouth daily. 90 tablet 3   No current facility-administered medications on file prior to visit.   Past Medical History:  Diagnosis Date   Acholic stool 0/93/8182   CAD (coronary artery disease) 04/01/2020   Cardiac murmur 01/02/2020   Cardiomyopathy ([place]) 02/14/2020   Dyslipidemia 01/03/2014   Fatigue 12/25/2019   History of Lyme disease 01/02/2020   Lyme disease 2017   Mixed hyperlipidemia 12/25/2019   Palpitations 01/02/2020   Premature ventricular contraction 01/03/2014   Past Surgical History:  Procedure Laterality Date   TONSILLECTOMY  1966    Family History  Problem Relation Age of Onset   Rheum arthritis Mother    Heart failure Father    Social History   Socioeconomic History   Marital status: Married    Spouse name: Not on file   Number of children: Not on file   Years of education: Not on file   Highest education level: Not on file  Occupational History   Not on file  Tobacco Use   Smoking status: Never   Smokeless tobacco: Never  Vaping Use   Vaping Use: Never used  Substance and Sexual Activity   Alcohol use: Yes    Alcohol/week:  1.0 standard drink of alcohol    Types: 1 Glasses of wine per week    Comment: someday in the week   Drug use: Never   Sexual activity: Not Currently  Other Topics Concern   Not on file  Social History Narrative   Not on file   Social Determinants of Health   Financial Resource Strain: Not on file  Food Insecurity: Not on file  Transportation Needs: Not on file  Physical Activity: Not on file  Stress: Not on file  Social Connections: Not on file    Review of Systems  Constitutional:  Negative for chills, fatigue, fever and unexpected weight change.  HENT:  Negative for congestion, ear pain, sinus pain and sore throat.   Respiratory:  Positive for shortness of breath. Negative for cough.   Cardiovascular:  Negative for chest pain and palpitations.  Gastrointestinal:  Negative for abdominal pain, blood in stool, constipation, diarrhea, nausea and vomiting.  Endocrine: Negative for polydipsia.  Genitourinary:  Negative for dysuria.  Musculoskeletal:  Negative for back pain.  Skin:  Negative for rash.  Neurological:  Negative for headaches.     Objective:  BP 100/60   Pulse (!) 58   Temp 98.2 F (36.8 C)   Resp 14   Ht 6\' 1"  (1.854 m)   Wt 164 lb (74.4 kg)   SpO2 99%   BMI 21.64 kg/m  06/24/2022   10:14 AM 01/13/2022    8:33 AM 12/19/2021    9:01 AM  BP/Weight  Systolic BP 100 106 120  Diastolic BP 60 60 80  Wt. (Lbs) 164 163 168  BMI 21.64 kg/m2 21.51 kg/m2 22.16 kg/m2    Physical Exam Vitals reviewed.  Constitutional:      General: He is not in acute distress.    Appearance: Normal appearance.  HENT:     Head: Normocephalic.     Right Ear: Tympanic membrane normal.     Left Ear: Tympanic membrane normal.     Nose: Nose normal.     Mouth/Throat:     Mouth: Mucous membranes are moist.  Eyes:     Extraocular Movements: Extraocular movements intact.     Conjunctiva/sclera: Conjunctivae normal.     Pupils: Pupils are equal, round, and reactive to  light.  Cardiovascular:     Rate and Rhythm: Normal rate and regular rhythm.     Pulses: Normal pulses.     Heart sounds: Normal heart sounds. No murmur heard.    No gallop.  Pulmonary:     Effort: Pulmonary effort is normal. No respiratory distress.     Breath sounds: Normal breath sounds. No wheezing.  Abdominal:     General: Abdomen is flat. Bowel sounds are normal. There is no distension.     Tenderness: There is no abdominal tenderness.  Musculoskeletal:        General: Normal range of motion.     Cervical back: Normal range of motion.  Skin:    General: Skin is warm.     Capillary Refill: Capillary refill takes less than 2 seconds.  Neurological:     General: No focal deficit present.     Mental Status: He is alert and oriented to person, place, and time. Mental status is at baseline.     Gait: Gait normal.         Lab Results  Component Value Date   WBC 5.4 06/24/2022   HGB 14.9 06/24/2022   HCT 44.0 06/24/2022   PLT 212 06/24/2022   GLUCOSE 91 06/24/2022   CHOL 147 06/24/2022   TRIG 72 06/24/2022   HDL 71 06/24/2022   LDLCALC 62 06/24/2022   ALT 22 06/24/2022   AST 23 06/24/2022   NA 142 06/24/2022   K 4.6 06/24/2022   CL 105 06/24/2022   CREATININE 1.02 06/24/2022   BUN 19 06/24/2022   CO2 23 06/24/2022   TSH 1.920 12/25/2019      Assessment & Plan:   Problem List Items Addressed This Visit       Cardiovascular and Mediastinum   CAD (coronary artery disease)   Relevant Orders   EKG 12-Lead (Completed) An individual plan was formulated based on patient history and exam, labs and evidence based data. Patient has not had recent angina or nitroglycerin use. continue present treatment.    PAD (peripheral artery disease) (HCC) Patient has poor circulation but no recent claudication     Other   Mixed hyperlipidemia - Primary   Relevant Orders   Comprehensive metabolic panel (Completed)   Lipid panel (Completed)   CBC with Differential/Platelet  (Completed) AN INDIVIDUAL CARE PLAN for hyperlipidemia/ cholesterol was established and reinforced today.  The patient's status was assessed using clinical findings on exam, lab and other diagnostic tests. The patient's disease status was assessed based on evidence-based guidelines and found to be fair controlled. MEDICATIONS were reviewed. SELF MANAGEMENT GOALS have been  discussed and patient's success at attaining the goal of low cholesterol was assessed. RECOMMENDATION given include regular exercise 3 days a week and low cholesterol/low fat diet. CLINICAL SUMMARY including written plan to identify barriers unique to the patient due to social or economic  reasons was discussed.    Other Visit Diagnoses     Need for hepatitis C screening test       Relevant Orders   Hepatitis C Antibody (Completed)   Screening for HIV (human immunodeficiency virus)       Relevant Orders   HIV antibody (with reflex) (Completed)     .    Orders Placed This Encounter  Procedures   Comprehensive metabolic panel   Lipid panel   CBC with Differential/Platelet   Hepatitis C Antibody   HIV antibody (with reflex)   Cardiovascular Risk Assessment   EKG 12-Lead     Follow-up: Return in about 6 months (around 12/24/2022).  An After Visit Summary was printed and given to the patient.  Brent Bulla, MD [facility] [phone]

## 2022-06-25 LAB — CBC WITH DIFFERENTIAL/PLATELET
Basophils Absolute: 0.1 10*3/uL (ref 0.0–0.2)
Basos: 1 %
EOS (ABSOLUTE): 0.3 10*3/uL (ref 0.0–0.4)
Eos: 5 %
Hematocrit: 44 % (ref 37.5–51.0)
Hemoglobin: 14.9 g/dL (ref 13.0–17.7)
Immature Grans (Abs): 0 10*3/uL (ref 0.0–0.1)
Immature Granulocytes: 0 %
Lymphocytes Absolute: 1.4 10*3/uL (ref 0.7–3.1)
Lymphs: 27 %
MCH: 30.7 pg (ref 26.6–33.0)
MCHC: 33.9 g/dL (ref 31.5–35.7)
MCV: 91 fL (ref 79–97)
Monocytes Absolute: 0.5 10*3/uL (ref 0.1–0.9)
Monocytes: 10 %
Neutrophils Absolute: 3.1 10*3/uL (ref 1.4–7.0)
Neutrophils: 57 %
Platelets: 212 10*3/uL (ref 150–450)
RBC: 4.85 x10E6/uL (ref 4.14–5.80)
RDW: 12.9 % (ref 11.6–15.4)
WBC: 5.4 10*3/uL (ref 3.4–10.8)

## 2022-06-25 LAB — LIPID PANEL
Chol/HDL Ratio: 2.1 ratio (ref 0.0–5.0)
Cholesterol, Total: 147 mg/dL (ref 100–199)
HDL: 71 mg/dL (ref >39)
LDL Chol Calc (NIH): 62 mg/dL (ref 0–99)
Triglycerides: 72 mg/dL (ref 0–149)
VLDL Cholesterol Cal: 14 mg/dL (ref 5–40)

## 2022-06-25 LAB — COMPREHENSIVE METABOLIC PANEL
ALT: 22 IU/L (ref 0–44)
AST: 23 IU/L (ref 0–40)
Albumin/Globulin Ratio: 2.8 — ABNORMAL HIGH (ref 1.2–2.2)
Albumin: 4.7 g/dL (ref 3.9–4.9)
Alkaline Phosphatase: 56 IU/L (ref 44–121)
BUN/Creatinine Ratio: 19 (ref 10–24)
BUN: 19 mg/dL (ref 8–27)
Bilirubin Total: 0.3 mg/dL (ref 0.0–1.2)
CO2: 23 mmol/L (ref 20–29)
Calcium: 9.4 mg/dL (ref 8.6–10.2)
Chloride: 105 mmol/L (ref 96–106)
Creatinine, Ser: 1.02 mg/dL (ref 0.76–1.27)
Globulin, Total: 1.7 g/dL (ref 1.5–4.5)
Glucose: 91 mg/dL (ref 70–99)
Potassium: 4.6 mmol/L (ref 3.5–5.2)
Sodium: 142 mmol/L (ref 134–144)
Total Protein: 6.4 g/dL (ref 6.0–8.5)
eGFR: 83 mL/min/{1.73_m2} (ref >59)

## 2022-06-25 LAB — CARDIOVASCULAR RISK ASSESSMENT

## 2022-06-25 LAB — HEPATITIS C ANTIBODY: Hep C Virus Ab: NONREACTIVE

## 2022-06-25 LAB — HIV ANTIBODY (ROUTINE TESTING W REFLEX): HIV Screen 4th Generation wRfx: NONREACTIVE

## 2022-06-25 NOTE — Progress Notes (Signed)
Kidney and liver tests normal, Cholesterol normal, CBC normal, Hepatitis c negative, HIV negative lp

## 2022-06-30 ENCOUNTER — Ambulatory Visit: Payer: Managed Care, Other (non HMO) | Admitting: Legal Medicine

## 2022-12-31 ENCOUNTER — Ambulatory Visit: Payer: Managed Care, Other (non HMO) | Admitting: Physician Assistant
